# Patient Record
Sex: Female | Born: 1945 | Race: White | Hispanic: No | Marital: Married | Smoking: Never smoker
Health system: Southern US, Community
[De-identification: ages and names within clinical notes are randomized; demographics above are authoritative.]

## PROBLEM LIST (undated history)

## (undated) DIAGNOSIS — E079 Disorder of thyroid, unspecified: Secondary | ICD-10-CM

## (undated) DIAGNOSIS — K469 Unspecified abdominal hernia without obstruction or gangrene: Secondary | ICD-10-CM

## (undated) DIAGNOSIS — I10 Essential (primary) hypertension: Secondary | ICD-10-CM

## (undated) DIAGNOSIS — I499 Cardiac arrhythmia, unspecified: Secondary | ICD-10-CM

## (undated) DIAGNOSIS — G709 Myoneural disorder, unspecified: Secondary | ICD-10-CM

## (undated) DIAGNOSIS — N189 Chronic kidney disease, unspecified: Secondary | ICD-10-CM

## (undated) DIAGNOSIS — T8859XA Other complications of anesthesia, initial encounter: Secondary | ICD-10-CM

## (undated) DIAGNOSIS — C801 Malignant (primary) neoplasm, unspecified: Secondary | ICD-10-CM

## (undated) DIAGNOSIS — M1990 Unspecified osteoarthritis, unspecified site: Secondary | ICD-10-CM

## (undated) DIAGNOSIS — T4145XA Adverse effect of unspecified anesthetic, initial encounter: Secondary | ICD-10-CM

## (undated) DIAGNOSIS — E039 Hypothyroidism, unspecified: Secondary | ICD-10-CM

## (undated) HISTORY — DX: Disorder of thyroid, unspecified: E07.9

## (undated) HISTORY — DX: Unspecified osteoarthritis, unspecified site: M19.90

## (undated) HISTORY — DX: Malignant (primary) neoplasm, unspecified: C80.1

## (undated) HISTORY — PX: HERNIA REPAIR: SHX51

## (undated) HISTORY — DX: Unspecified abdominal hernia without obstruction or gangrene: K46.9

## (undated) HISTORY — PX: OTHER SURGICAL HISTORY: SHX169

## (undated) HISTORY — PX: MELANOMA EXCISION: SHX5266

## (undated) HISTORY — PX: CYSTOSCOPY: SHX5120

---

## 1998-03-29 ENCOUNTER — Other Ambulatory Visit: Admission: RE | Admit: 1998-03-29 | Discharge: 1998-03-29 | Payer: Self-pay | Admitting: *Deleted

## 1999-04-04 ENCOUNTER — Other Ambulatory Visit: Admission: RE | Admit: 1999-04-04 | Discharge: 1999-04-04 | Payer: Self-pay | Admitting: *Deleted

## 1999-05-09 ENCOUNTER — Encounter: Admission: RE | Admit: 1999-05-09 | Discharge: 1999-08-07 | Payer: Self-pay | Admitting: Cardiothoracic Surgery

## 2000-03-02 ENCOUNTER — Encounter: Payer: Self-pay | Admitting: Cardiothoracic Surgery

## 2000-03-02 ENCOUNTER — Encounter: Admission: RE | Admit: 2000-03-02 | Discharge: 2000-03-02 | Payer: Self-pay | Admitting: Cardiothoracic Surgery

## 2000-04-05 ENCOUNTER — Other Ambulatory Visit: Admission: RE | Admit: 2000-04-05 | Discharge: 2000-04-05 | Payer: Self-pay | Admitting: *Deleted

## 2001-03-14 ENCOUNTER — Encounter: Admission: RE | Admit: 2001-03-14 | Discharge: 2001-03-14 | Payer: Self-pay | Admitting: Cardiothoracic Surgery

## 2001-03-14 ENCOUNTER — Encounter: Payer: Self-pay | Admitting: Cardiothoracic Surgery

## 2002-03-17 ENCOUNTER — Ambulatory Visit (HOSPITAL_BASED_OUTPATIENT_CLINIC_OR_DEPARTMENT_OTHER): Admission: RE | Admit: 2002-03-17 | Discharge: 2002-03-17 | Payer: Self-pay

## 2004-05-23 ENCOUNTER — Other Ambulatory Visit: Admission: RE | Admit: 2004-05-23 | Discharge: 2004-05-23 | Payer: Self-pay | Admitting: Family Medicine

## 2009-06-22 ENCOUNTER — Encounter: Admission: RE | Admit: 2009-06-22 | Discharge: 2009-06-22 | Payer: Self-pay | Admitting: Chiropractor

## 2010-10-09 HISTORY — PX: OTHER SURGICAL HISTORY: SHX169

## 2010-10-09 HISTORY — PX: KNEE ARTHROSCOPY: SUR90

## 2010-10-18 ENCOUNTER — Ambulatory Visit
Admission: RE | Admit: 2010-10-18 | Discharge: 2010-10-18 | Payer: Self-pay | Source: Home / Self Care | Attending: Orthopedic Surgery | Admitting: Orthopedic Surgery

## 2010-10-24 LAB — POCT HEMOGLOBIN-HEMACUE: Hemoglobin: 12.8 g/dL (ref 12.0–15.0)

## 2010-10-24 LAB — BASIC METABOLIC PANEL
BUN: 13 mg/dL (ref 6–23)
CO2: 28 mEq/L (ref 19–32)
Calcium: 9.9 mg/dL (ref 8.4–10.5)
Chloride: 105 mEq/L (ref 96–112)
Creatinine, Ser: 0.89 mg/dL (ref 0.4–1.2)
GFR calc Af Amer: >60 mL/min (ref >60)
GFR calc non Af Amer: >60 mL/min (ref >60)
Glucose, Bld: 53 mg/dL — ABNORMAL LOW (ref 70–99)
Potassium: 3.2 mEq/L — ABNORMAL LOW (ref 3.5–5.1)
Sodium: 141 mEq/L (ref 135–145)

## 2011-01-02 ENCOUNTER — Encounter (HOSPITAL_BASED_OUTPATIENT_CLINIC_OR_DEPARTMENT_OTHER)
Admission: RE | Admit: 2011-01-02 | Discharge: 2011-01-02 | Disposition: A | Discharge status: Home / Self Care | Payer: BC Managed Care – PPO | Source: Ambulatory Visit | Attending: Surgery | Admitting: Surgery

## 2011-01-02 LAB — BASIC METABOLIC PANEL
BUN: 14 mg/dL (ref 6–23)
CO2: 29 mEq/L (ref 19–32)
Calcium: 9.8 mg/dL (ref 8.4–10.5)
Chloride: 105 mEq/L (ref 96–112)
Creatinine, Ser: 0.79 mg/dL (ref 0.4–1.2)
GFR calc Af Amer: >60 mL/min (ref >60)
GFR calc non Af Amer: >60 mL/min (ref >60)
Glucose, Bld: 92 mg/dL (ref 70–99)
Potassium: 3.8 mEq/L (ref 3.5–5.1)
Sodium: 138 mEq/L (ref 135–145)

## 2011-01-03 ENCOUNTER — Other Ambulatory Visit: Payer: Self-pay | Admitting: Surgery

## 2011-01-03 ENCOUNTER — Ambulatory Visit (HOSPITAL_BASED_OUTPATIENT_CLINIC_OR_DEPARTMENT_OTHER)
Admission: RE | Admit: 2011-01-03 | Discharge: 2011-01-03 | Disposition: A | Discharge status: Home / Self Care | Payer: BC Managed Care – PPO | Source: Ambulatory Visit | Attending: Surgery | Admitting: Surgery

## 2011-01-03 DIAGNOSIS — I712 Thoracic aortic aneurysm, without rupture, unspecified: Secondary | ICD-10-CM | POA: Insufficient documentation

## 2011-01-03 DIAGNOSIS — K429 Umbilical hernia without obstruction or gangrene: Secondary | ICD-10-CM | POA: Insufficient documentation

## 2011-01-03 DIAGNOSIS — E039 Hypothyroidism, unspecified: Secondary | ICD-10-CM | POA: Insufficient documentation

## 2011-01-03 DIAGNOSIS — K4090 Unilateral inguinal hernia, without obstruction or gangrene, not specified as recurrent: Secondary | ICD-10-CM | POA: Insufficient documentation

## 2011-01-03 DIAGNOSIS — Z01812 Encounter for preprocedural laboratory examination: Secondary | ICD-10-CM | POA: Insufficient documentation

## 2011-01-03 LAB — POCT HEMOGLOBIN-HEMACUE: Hemoglobin: 13.4 g/dL (ref 12.0–15.0)

## 2011-01-06 NOTE — Op Note (Signed)
NAME:  Donna Alvarez, Donna Alvarez NO.:  [id number]  MEDICAL RECORD NO.:  [id number]           PATIENT TYPE:  LOCATION:                                 FACILITY:  PHYSICIAN:  Currie Paris, M.D.DATE OF BIRTH:  1945-11-14  DATE OF PROCEDURE:  01/03/2011 DATE OF DISCHARGE:                              OPERATIVE REPORT   OFFICE MEDICAL CCS:  [phone].  PREOPERATIVE DIAGNOSES: 1. Right inguinal hernia, probably indirect. 2. Umbilical hernia.  POSTOPERATIVE DIAGNOSES: 1. Indirect right inguinal hernia. 2. Umbilical hernia.  PROCEDURE:  Repair with mesh.  SURGEON:  Currie Paris, MD  ANESTHESIA:  General.  CLINICAL HISTORY:  This is a 65 year old who has a gradually enlarging right inguinal hernia, she has elected to have it repaired.  On exam, she was also noted to have a small umbilical hernia and wished to have this repaired at the same anesthesia.  DESCRIPTION OF PROCEDURE:  I saw the patient in the holding area and confirmed the plans as noted above, she had no further questions.  I initialed the right inguinal area and the umbilical area as operative sites.  The patient was taken to the operating room, and after satisfactory general (LMA) anesthesia had been obtained, the abdomen was clipped, prepped, and draped and the time-out done.  I injected 0.25% plain Marcaine along the right inguinal area and subfascially.  I made an inguinal incision, exposed the external oblique, and opened it in line of its fibers.  The "cord" was identified and dissected up and divided distally right as it attached to the pubic tubercle with that being tied with a 4-0 Vicryl.  There was a sac present which was opened and there had been what looked like a little omentum protruding out, but I pushed all that back and ligated the entire cord structures with suture ligature near the deep ring using 2-0 silk.  I then took some Ultrapro and did an entire coverage of the  floor, sutured in starting at pubic tubercle running along the reflection of the inguinal ligament throughout as well beyond the deep ring.  Laid well up on the internal oblique muscle and then out laterally and was tacked down.  I put more local in and then made sure everything was dry. I closed the external oblique with 3-0 Vicryl, Scarpa's with 3-0 Vicryl, and the skin with 4-0 Monocryl subcuticular plus Dermabond.  Attention was turned to the umbilical area and I put a local in there and made a short curvilinear incision at the bottom of the umbilicus and elevated the skin.  I initially saw some preperitoneal fat protruding out, freed that up, and then there was some more preperitoneal fat in the subcutaneous tissue which was freed up off the skin, and I could see there was a single defect which is about 1 cm contained only some of this preperitoneal fat.  I reduced all of that and exposed fascia.  I cut a small piece of Ultrapro and fashioned it into a plug, put it in the defect, and closed the defect with three sutures of 2-0 Prolene incorporating the mesh.  This closed easily.  I  made sure everything was dry and then closed with some 3-0 Vicryl, 4-0 Monocryl subcuticular, and Dermabond.  The patient tolerated the procedure well, and there were no complications.  All counts were correct.     Currie Paris, M.D.     CJS/MEDQ  D:  01/03/2011  T:  01/04/2011  Job:  [id number]  cc:   Dr. Ulla Potash  Electronically Signed by Cyndia Bent M.D. on 01/06/2011 07:25:17 AM

## 2011-02-24 ENCOUNTER — Encounter (INDEPENDENT_AMBULATORY_CARE_PROVIDER_SITE_OTHER): Payer: Self-pay | Admitting: Surgery

## 2011-02-24 NOTE — Op Note (Signed)
[place]  Patient:    Donna Alvarez, ELLISTON Visit Number: [id number] MRN: [id number]          Service Type: DSU Location: [facility] Attending Physician:  Meredith Leeds Dictated by:   Zigmund Daniel, M.D. Proc. Date: 03/17/02 Admit Date:  03/17/2002 Discharge Date: 03/17/2002                             Operative Report  PREOPERATIVE DIAGNOSIS:  Right femoral hernia.  POSTOPERATIVE DIAGNOSIS:  Right femoral hernia.  PROCEDURE:  Repair of right femoral hernia.  SURGEON:  Zigmund Daniel, M.D.  ANESTHESIA:  Local with sedation.  DESCRIPTION OF PROCEDURE:  After the patient was monitored and sedated and had routine preparation and draping of the right groin region, I liberally infused long-acting local anesthetic in and around the palpable mass just above the right groin crease.  I then made a short incision over it and dissected down to it and separated it from the surrounding normal fat.  I noted that it was coming out through the femoral canal through a narrow neck.  After delineating the anatomy, I cut the lacunar ligament medial to the hernia about 1 cm. That allowed reduction of the hernia without opening the hernia sac.  I then plugged the defect with a rolled-up plug of polypropylene mesh and sewed that in with three sutures of 2-0 Prolene placed in the superior, medial, and inferior aspects of the defect, avoiding the femoral vein.  That appeared secure.  Hemostasis was good.  I closed the subcutaneous tissues with running 3-0 Vicryl and closed the skin with intracuticular 4-0 Vicryl and Steri-Strips.  She tolerated the operation well. Dictated by:   Zigmund Daniel, M.D. Attending Physician:  Meredith Leeds DD:  03/17/02 TD:  03/19/02 Job: [id number]

## 2011-09-26 ENCOUNTER — Ambulatory Visit (INDEPENDENT_AMBULATORY_CARE_PROVIDER_SITE_OTHER): Payer: BC Managed Care – PPO

## 2011-09-26 DIAGNOSIS — J029 Acute pharyngitis, unspecified: Secondary | ICD-10-CM

## 2011-09-26 DIAGNOSIS — R059 Cough, unspecified: Secondary | ICD-10-CM

## 2011-09-26 DIAGNOSIS — R05 Cough: Secondary | ICD-10-CM

## 2011-09-26 DIAGNOSIS — J111 Influenza due to unidentified influenza virus with other respiratory manifestations: Secondary | ICD-10-CM

## 2012-12-17 ENCOUNTER — Other Ambulatory Visit: Payer: Self-pay | Admitting: Neurosurgery

## 2012-12-18 ENCOUNTER — Encounter (HOSPITAL_COMMUNITY): Payer: Self-pay | Admitting: Pharmacy Technician

## 2012-12-21 NOTE — Pre-Procedure Instructions (Signed)
YECENIA DALGLEISH  12/21/2012   Your procedure is scheduled on:  12/25/2012  Report to [facility] at 6:45 AM.  Call this number if you have problems the morning of surgery: [phone]   Remember:   Do not eat food or drink liquids after midnight.  TUESDAY   Take these medicines the morning of surgery with A SIP OF WATER: Levoxyl, Norvasc, Gabapentin   Do not wear jewelry, make-up or nail polish.  Do not wear lotions, powders, or perfumes. You may wear deodorant.  Do not shave 48 hours prior to surgery.   Do not bring valuables to the hospital.  Contacts, dentures or bridgework may not be worn into surgery.  Leave suitcase in the car. After surgery it may be brought to your room.  For patients admitted to the hospital, checkout time is 11:00 AM the day of  discharge.   Patients discharged the day of surgery will not be allowed to drive  home.  Name and phone number of your driver: with spouse  Special Instructions: Shower using CHG 2 nights before surgery and the night before surgery.  If you shower the day of surgery use CHG.  Use special wash - you have one bottle of CHG for all showers.  You should use approximately 1/3 of the bottle for each shower.   Please read over the following fact sheets that you were given: Pain Booklet, Coughing and Deep Breathing, MRSA Information and Surgical Site Infection Prevention

## 2012-12-23 ENCOUNTER — Encounter (HOSPITAL_COMMUNITY)
Admission: RE | Admit: 2012-12-23 | Discharge: 2012-12-23 | Disposition: A | Discharge status: Home / Self Care | Payer: BC Managed Care – PPO | Source: Ambulatory Visit | Attending: Neurosurgery | Admitting: Neurosurgery

## 2012-12-23 ENCOUNTER — Encounter (HOSPITAL_COMMUNITY): Payer: Self-pay

## 2012-12-23 HISTORY — DX: Myoneural disorder, unspecified: G70.9

## 2012-12-23 HISTORY — DX: Other complications of anesthesia, initial encounter: T88.59XA

## 2012-12-23 HISTORY — DX: Chronic kidney disease, unspecified: N18.9

## 2012-12-23 HISTORY — DX: Cardiac arrhythmia, unspecified: I49.9

## 2012-12-23 HISTORY — DX: Essential (primary) hypertension: I10

## 2012-12-23 HISTORY — DX: Hypothyroidism, unspecified: E03.9

## 2012-12-23 HISTORY — DX: Adverse effect of unspecified anesthetic, initial encounter: T41.45XA

## 2012-12-23 LAB — SURGICAL PCR SCREEN
MRSA, PCR: NEGATIVE
Staphylococcus aureus: POSITIVE — AB

## 2012-12-23 LAB — BASIC METABOLIC PANEL
Calcium: 10.3 mg/dL (ref 8.4–10.5)
Chloride: 106 mEq/L (ref 96–112)
Creatinine, Ser: 0.7 mg/dL (ref 0.50–1.10)
GFR calc Af Amer: >90 mL/min (ref >90)
GFR calc non Af Amer: 87 mL/min — ABNORMAL LOW (ref >90)

## 2012-12-23 LAB — CBC
MCHC: 35.5 g/dL (ref 30.0–36.0)
Platelets: 250 10*3/uL (ref 150–400)
RDW: 13.1 % (ref 11.5–15.5)
WBC: 5.8 10*3/uL (ref 4.0–10.5)

## 2012-12-24 MED ORDER — CEFAZOLIN SODIUM-DEXTROSE 2-3 GM-% IV SOLR
2.0000 g | INTRAVENOUS | Status: AC
  Administered 2012-12-25: 2 g via INTRAVENOUS
  Filled 2012-12-24: qty 50

## 2012-12-25 ENCOUNTER — Ambulatory Visit (HOSPITAL_COMMUNITY)
Admission: RE | Admit: 2012-12-25 | Discharge: 2012-12-25 | DRG: 758 | Disposition: A | Discharge status: Home / Self Care | Payer: BC Managed Care – PPO | Source: Ambulatory Visit | Attending: Neurosurgery | Admitting: Neurosurgery

## 2012-12-25 ENCOUNTER — Encounter (HOSPITAL_COMMUNITY)
Admission: RE | Disposition: A | Discharge status: Home / Self Care | Payer: Self-pay | Source: Ambulatory Visit | Attending: Neurosurgery

## 2012-12-25 ENCOUNTER — Inpatient Hospital Stay (HOSPITAL_COMMUNITY): Payer: BC Managed Care – PPO

## 2012-12-25 ENCOUNTER — Encounter (HOSPITAL_COMMUNITY): Payer: Self-pay | Admitting: Anesthesiology

## 2012-12-25 ENCOUNTER — Encounter (HOSPITAL_COMMUNITY): Payer: Self-pay | Admitting: *Deleted

## 2012-12-25 ENCOUNTER — Inpatient Hospital Stay (HOSPITAL_COMMUNITY): Payer: BC Managed Care – PPO | Admitting: Anesthesiology

## 2012-12-25 DIAGNOSIS — Z791 Long term (current) use of non-steroidal anti-inflammatories (NSAID): Secondary | ICD-10-CM

## 2012-12-25 DIAGNOSIS — Z01818 Encounter for other preprocedural examination: Secondary | ICD-10-CM | POA: Insufficient documentation

## 2012-12-25 DIAGNOSIS — Z85828 Personal history of other malignant neoplasm of skin: Secondary | ICD-10-CM

## 2012-12-25 DIAGNOSIS — M4310 Spondylolisthesis, site unspecified: Secondary | ICD-10-CM | POA: Diagnosis present

## 2012-12-25 DIAGNOSIS — G2581 Restless legs syndrome: Secondary | ICD-10-CM | POA: Diagnosis present

## 2012-12-25 DIAGNOSIS — I10 Essential (primary) hypertension: Secondary | ICD-10-CM | POA: Diagnosis present

## 2012-12-25 DIAGNOSIS — M19049 Primary osteoarthritis, unspecified hand: Secondary | ICD-10-CM | POA: Diagnosis present

## 2012-12-25 DIAGNOSIS — Z79899 Other long term (current) drug therapy: Secondary | ICD-10-CM

## 2012-12-25 DIAGNOSIS — E039 Hypothyroidism, unspecified: Secondary | ICD-10-CM | POA: Diagnosis present

## 2012-12-25 DIAGNOSIS — M5126 Other intervertebral disc displacement, lumbar region: Secondary | ICD-10-CM | POA: Diagnosis present

## 2012-12-25 DIAGNOSIS — Z01812 Encounter for preprocedural laboratory examination: Secondary | ICD-10-CM | POA: Insufficient documentation

## 2012-12-25 DIAGNOSIS — Z87442 Personal history of urinary calculi: Secondary | ICD-10-CM

## 2012-12-25 DIAGNOSIS — Z85820 Personal history of malignant melanoma of skin: Secondary | ICD-10-CM

## 2012-12-25 HISTORY — PX: LUMBAR LAMINECTOMY/DECOMPRESSION MICRODISCECTOMY: SHX5026

## 2012-12-25 SURGERY — LUMBAR LAMINECTOMY/DECOMPRESSION MICRODISCECTOMY 1 LEVEL
Anesthesia: General | Laterality: Right | Wound class: Clean

## 2012-12-25 MED ORDER — OXYCODONE-ACETAMINOPHEN 5-325 MG PO TABS
1.0000 | ORAL_TABLET | ORAL | Status: DC | PRN
  Administered 2012-12-25: 1 via ORAL
  Filled 2012-12-25: qty 1

## 2012-12-25 MED ORDER — ACETAMINOPHEN 325 MG PO TABS: 650.0000 mg | ORAL_TABLET | ORAL | Status: DC | PRN

## 2012-12-25 MED ORDER — NEOSTIGMINE METHYLSULFATE 1 MG/ML IJ SOLN
INTRAMUSCULAR | Status: DC | PRN
  Administered 2012-12-25: 3 mg via INTRAVENOUS

## 2012-12-25 MED ORDER — OXYCODONE HCL 5 MG/5ML PO SOLN
5.0000 mg | Freq: Once | ORAL | Status: DC | PRN
Start: 2012-12-25 — End: 2012-12-25

## 2012-12-25 MED ORDER — OXYCODONE-ACETAMINOPHEN 5-325 MG PO TABS: 1.0000 | ORAL_TABLET | Freq: Four times a day (QID) | ORAL | Status: DC | PRN

## 2012-12-25 MED ORDER — ALUM & MAG HYDROXIDE-SIMETH 200-200-20 MG/5ML PO SUSP: 30.0000 mL | Freq: Four times a day (QID) | ORAL | Status: DC | PRN

## 2012-12-25 MED ORDER — PHENOL 1.4 % MT LIQD: 1.0000 | OROMUCOSAL | Status: DC | PRN

## 2012-12-25 MED ORDER — DOCUSATE SODIUM 100 MG PO CAPS: 100.0000 mg | ORAL_CAPSULE | Freq: Two times a day (BID) | ORAL | Status: DC

## 2012-12-25 MED ORDER — BUPIVACAINE-EPINEPHRINE PF 0.5-1:200000 % IJ SOLN
INTRAMUSCULAR | Status: DC | PRN
  Administered 2012-12-25: 20 mL

## 2012-12-25 MED ORDER — MIDAZOLAM HCL 5 MG/5ML IJ SOLN
INTRAMUSCULAR | Status: DC | PRN
  Administered 2012-12-25: 2 mg via INTRAVENOUS

## 2012-12-25 MED ORDER — FUROSEMIDE 20 MG PO TABS
20.0000 mg | ORAL_TABLET | Freq: Every day | ORAL | Status: DC
  Filled 2012-12-25: qty 1

## 2012-12-25 MED ORDER — OXYCODONE HCL 5 MG PO TABS: 5.0000 mg | ORAL_TABLET | Freq: Once | ORAL | Status: DC | PRN

## 2012-12-25 MED ORDER — 0.9 % SODIUM CHLORIDE (POUR BTL) OPTIME
TOPICAL | Status: DC | PRN
  Administered 2012-12-25: 1000 mL

## 2012-12-25 MED ORDER — PHENYLEPHRINE HCL 10 MG/ML IJ SOLN
INTRAMUSCULAR | Status: DC | PRN
  Administered 2012-12-25 (×3): 80 ug via INTRAVENOUS

## 2012-12-25 MED ORDER — ONDANSETRON HCL 4 MG/2ML IJ SOLN: 4.0000 mg | INTRAMUSCULAR | Status: DC | PRN

## 2012-12-25 MED ORDER — IBUPROFEN 400 MG PO TABS
400.0000 mg | ORAL_TABLET | Freq: Four times a day (QID) | ORAL | Status: DC | PRN
  Filled 2012-12-25: qty 1

## 2012-12-25 MED ORDER — FENTANYL CITRATE 0.05 MG/ML IJ SOLN
INTRAMUSCULAR | Status: DC | PRN
  Administered 2012-12-25 (×3): 50 ug via INTRAVENOUS

## 2012-12-25 MED ORDER — SODIUM CHLORIDE 0.9 % IR SOLN
Status: DC | PRN
  Administered 2012-12-25: 09:00:00

## 2012-12-25 MED ORDER — ACETAMINOPHEN 650 MG RE SUPP: 650.0000 mg | RECTAL | Status: DC | PRN

## 2012-12-25 MED ORDER — ARTIFICIAL TEARS OP OINT
TOPICAL_OINTMENT | OPHTHALMIC | Status: DC | PRN
  Administered 2012-12-25: 1 via OPHTHALMIC

## 2012-12-25 MED ORDER — BACITRACIN 50000 UNITS IM SOLR
INTRAMUSCULAR | Status: AC
  Filled 2012-12-25: qty 1

## 2012-12-25 MED ORDER — PROPOFOL 10 MG/ML IV BOLUS
INTRAVENOUS | Status: DC | PRN
  Administered 2012-12-25: 150 mg via INTRAVENOUS

## 2012-12-25 MED ORDER — HEMOSTATIC AGENTS (NO CHARGE) OPTIME
TOPICAL | Status: DC | PRN
  Administered 2012-12-25: 1 via TOPICAL

## 2012-12-25 MED ORDER — GABAPENTIN 300 MG PO CAPS
300.0000 mg | ORAL_CAPSULE | Freq: Three times a day (TID) | ORAL | Status: DC
Start: 2012-12-25 — End: 2012-12-25
  Administered 2012-12-25: 300 mg via ORAL
  Filled 2012-12-25 (×2): qty 1

## 2012-12-25 MED ORDER — POTASSIUM CHLORIDE CRYS ER 10 MEQ PO TBCR
10.0000 meq | EXTENDED_RELEASE_TABLET | Freq: Two times a day (BID) | ORAL | Status: DC
  Administered 2012-12-25: 10 meq via ORAL
  Filled 2012-12-25 (×2): qty 1

## 2012-12-25 MED ORDER — THROMBIN 5000 UNITS EX SOLR
CUTANEOUS | Status: DC | PRN
  Administered 2012-12-25 (×2): 5000 [IU] via TOPICAL

## 2012-12-25 MED ORDER — EPHEDRINE SULFATE 50 MG/ML IJ SOLN
INTRAMUSCULAR | Status: DC | PRN
  Administered 2012-12-25: 10 mg via INTRAVENOUS

## 2012-12-25 MED ORDER — DIAZEPAM 5 MG PO TABS: 5.0000 mg | ORAL_TABLET | Freq: Four times a day (QID) | ORAL | Status: DC | PRN

## 2012-12-25 MED ORDER — MENTHOL 3 MG MT LOZG
1.0000 | LOZENGE | OROMUCOSAL | Status: DC | PRN
  Administered 2012-12-25: 3 mg via ORAL
  Filled 2012-12-25: qty 9

## 2012-12-25 MED ORDER — LIDOCAINE HCL (CARDIAC) 20 MG/ML IV SOLN
INTRAVENOUS | Status: DC | PRN
  Administered 2012-12-25: 80 mg via INTRAVENOUS

## 2012-12-25 MED ORDER — LACTATED RINGERS IV SOLN: INTRAVENOUS | Status: DC

## 2012-12-25 MED ORDER — AMLODIPINE BESYLATE 2.5 MG PO TABS
2.5000 mg | ORAL_TABLET | Freq: Every day | ORAL | Status: DC
  Filled 2012-12-25: qty 1

## 2012-12-25 MED ORDER — HYDROCODONE-ACETAMINOPHEN 5-325 MG PO TABS: 1.0000 | ORAL_TABLET | ORAL | Status: DC | PRN

## 2012-12-25 MED ORDER — PRAMIPEXOLE DIHYDROCHLORIDE 1.5 MG PO TABS
1.5000 mg | ORAL_TABLET | Freq: Every day | ORAL | Status: DC
  Filled 2012-12-25: qty 1

## 2012-12-25 MED ORDER — LACTATED RINGERS IV SOLN
INTRAVENOUS | Status: DC | PRN
  Administered 2012-12-25: 08:00:00 via INTRAVENOUS

## 2012-12-25 MED ORDER — PROMETHAZINE HCL 25 MG/ML IJ SOLN: 6.2500 mg | INTRAMUSCULAR | Status: DC | PRN

## 2012-12-25 MED ORDER — CEFAZOLIN SODIUM-DEXTROSE 2-3 GM-% IV SOLR
2.0000 g | Freq: Three times a day (TID) | INTRAVENOUS | Status: DC
  Administered 2012-12-25: 2 g via INTRAVENOUS
  Filled 2012-12-25 (×3): qty 50

## 2012-12-25 MED ORDER — ROCURONIUM BROMIDE 100 MG/10ML IV SOLN
INTRAVENOUS | Status: DC | PRN
  Administered 2012-12-25: 40 mg via INTRAVENOUS

## 2012-12-25 MED ORDER — GLYCOPYRROLATE 0.2 MG/ML IJ SOLN
INTRAMUSCULAR | Status: DC | PRN
  Administered 2012-12-25: 0.4 mg via INTRAVENOUS

## 2012-12-25 MED ORDER — ONDANSETRON HCL 4 MG/2ML IJ SOLN
INTRAMUSCULAR | Status: DC | PRN
  Administered 2012-12-25: 4 mg via INTRAVENOUS

## 2012-12-25 MED ORDER — LEVOTHYROXINE SODIUM 50 MCG PO TABS
50.0000 ug | ORAL_TABLET | Freq: Every day | ORAL | Status: DC
  Filled 2012-12-25: qty 1

## 2012-12-25 MED ORDER — MORPHINE SULFATE 2 MG/ML IJ SOLN: 1.0000 mg | INTRAMUSCULAR | Status: DC | PRN

## 2012-12-25 MED ORDER — BACITRACIN ZINC 500 UNIT/GM EX OINT
TOPICAL_OINTMENT | CUTANEOUS | Status: DC | PRN
  Administered 2012-12-25: 1 via TOPICAL

## 2012-12-25 MED ORDER — SODIUM CHLORIDE 0.9 % IV SOLN
INTRAVENOUS | Status: AC
  Filled 2012-12-25: qty 500

## 2012-12-25 MED ORDER — HYDROMORPHONE HCL PF 1 MG/ML IJ SOLN: 0.2500 mg | INTRAMUSCULAR | Status: DC | PRN

## 2012-12-25 SURGICAL SUPPLY — 52 items
BAG DECANTER FOR FLEXI CONT (MISCELLANEOUS) ×2 IMPLANT
BENZOIN TINCTURE PRP APPL 2/3 (GAUZE/BANDAGES/DRESSINGS) ×2 IMPLANT
BLADE SURG ROTATE 9660 (MISCELLANEOUS) IMPLANT
BRUSH SCRUB EZ PLAIN DRY (MISCELLANEOUS) ×2 IMPLANT
BUR ACORN 6.0 (BURR) ×2 IMPLANT
BUR MATCHSTICK NEURO 3.0 LAGG (BURR) ×2 IMPLANT
CANISTER SUCTION 2500CC (MISCELLANEOUS) ×2 IMPLANT
CLOTH BEACON ORANGE TIMEOUT ST (SAFETY) ×2 IMPLANT
CONT SPEC 4OZ CLIKSEAL STRL BL (MISCELLANEOUS) ×2 IMPLANT
DRAPE LAPAROTOMY 100X72X124 (DRAPES) ×2 IMPLANT
DRAPE MICROSCOPE LEICA (MISCELLANEOUS) ×2 IMPLANT
DRAPE POUCH INSTRU U-SHP 10X18 (DRAPES) ×2 IMPLANT
DRAPE SURG 17X23 STRL (DRAPES) ×8 IMPLANT
ELECT BLADE 4.0 EZ CLEAN MEGAD (MISCELLANEOUS) ×2
ELECT REM PT RETURN 9FT ADLT (ELECTROSURGICAL) ×2
ELECTRODE BLDE 4.0 EZ CLN MEGD (MISCELLANEOUS) ×1 IMPLANT
ELECTRODE REM PT RTRN 9FT ADLT (ELECTROSURGICAL) ×1 IMPLANT
GAUZE SPONGE 4X4 16PLY XRAY LF (GAUZE/BANDAGES/DRESSINGS) IMPLANT
GLOVE BIO SURGEON STRL SZ8.5 (GLOVE) ×2 IMPLANT
GLOVE BIOGEL PI IND STRL 6.5 (GLOVE) ×1 IMPLANT
GLOVE BIOGEL PI INDICATOR 6.5 (GLOVE) ×1
GLOVE EXAM NITRILE LRG STRL (GLOVE) ×2 IMPLANT
GLOVE EXAM NITRILE MD LF STRL (GLOVE) IMPLANT
GLOVE EXAM NITRILE XL STR (GLOVE) IMPLANT
GLOVE EXAM NITRILE XS STR PU (GLOVE) IMPLANT
GLOVE INDICATOR 7.0 STRL GRN (GLOVE) ×4 IMPLANT
GLOVE OPTIFIT SS 6.5 STRL BRWN (GLOVE) ×4 IMPLANT
GLOVE SS BIOGEL STRL SZ 8 (GLOVE) ×1 IMPLANT
GLOVE SUPERSENSE BIOGEL SZ 8 (GLOVE) ×1
GOWN BRE IMP SLV AUR LG STRL (GOWN DISPOSABLE) ×4 IMPLANT
GOWN BRE IMP SLV AUR XL STRL (GOWN DISPOSABLE) ×2 IMPLANT
GOWN STRL REIN 2XL LVL4 (GOWN DISPOSABLE) IMPLANT
KIT BASIN OR (CUSTOM PROCEDURE TRAY) ×2 IMPLANT
KIT ROOM TURNOVER OR (KITS) ×2 IMPLANT
NEEDLE HYPO 21X1.5 SAFETY (NEEDLE) IMPLANT
NEEDLE HYPO 22GX1.5 SAFETY (NEEDLE) ×2 IMPLANT
NS IRRIG 1000ML POUR BTL (IV SOLUTION) ×2 IMPLANT
PACK LAMINECTOMY NEURO (CUSTOM PROCEDURE TRAY) ×2 IMPLANT
PAD ARMBOARD 7.5X6 YLW CONV (MISCELLANEOUS) ×6 IMPLANT
PATTIES SURGICAL .5 X1 (DISPOSABLE) IMPLANT
RUBBERBAND STERILE (MISCELLANEOUS) ×4 IMPLANT
SPONGE GAUZE 4X4 12PLY (GAUZE/BANDAGES/DRESSINGS) ×2 IMPLANT
SPONGE SURGIFOAM ABS GEL SZ50 (HEMOSTASIS) ×2 IMPLANT
STRIP CLOSURE SKIN 1/2X4 (GAUZE/BANDAGES/DRESSINGS) ×2 IMPLANT
SUT VIC AB 1 CT1 18XBRD ANBCTR (SUTURE) ×1 IMPLANT
SUT VIC AB 1 CT1 8-18 (SUTURE) ×1
SUT VIC AB 2-0 CP2 18 (SUTURE) ×2 IMPLANT
SYR 20CC LL (SYRINGE) IMPLANT
SYR 20ML ECCENTRIC (SYRINGE) ×2 IMPLANT
TOWEL OR 17X24 6PK STRL BLUE (TOWEL DISPOSABLE) ×2 IMPLANT
TOWEL OR 17X26 10 PK STRL BLUE (TOWEL DISPOSABLE) ×2 IMPLANT
WATER STERILE IRR 1000ML POUR (IV SOLUTION) ×2 IMPLANT

## 2012-12-25 NOTE — Discharge Summary (Signed)
Physician Discharge Summary  Patient ID: Donna Alvarez MRN: [id number] DOB/AGE: September 07, 1946 67 y.o.  Admit date: 12/25/2012 Discharge date: 12/25/2012  Admission Diagnoses:Right L5-S1 herniated disc, spinal stenosis, L4-5 spondylolisthesis, lumbago, lumbar radiculopathy   Discharge Diagnoses: Right L5-S1 herniated disc, spinal stenosis, L4-5 spondylolisthesis, lumbago, lumbar radiculopathy  Active Problems:   * No active hospital problems. *   Discharged Condition: good  Hospital Course: [name] was taken to the operating room where she underwent an uncomplicated Right L5/S1 lumbar laminectomy today. She has voided, ambulated, and tolerated a normal diet without difficulty. Her wound is clean, dry, and without signs of infection at discharge.  Consults: None  Significant Diagnostic Studies: none  Treatments: Right L5-S1 Intervertebral discectomy using micro-dissection   Discharge Exam: Blood pressure 94/54, pulse 56, temperature 97.6 F (36.4 C), temperature source Oral, resp. rate 18, SpO2 99.00%. General appearance: alert, cooperative, appears stated age and no distress Neurologic: Alert and oriented X 3, normal strength and tone. Normal symmetric reflexes. Normal coordination and gait  Disposition: 01-Home or Self Care     Medication List    TAKE these medications       amLODipine 2.5 MG tablet  Commonly known as:  NORVASC  Take 2.5 mg by mouth daily after breakfast.     calcitonin (salmon) 200 UNIT/ACT nasal spray  Commonly known as:  MIACALCIN/FORTICAL  Place 1 spray into the nose daily.     Cholecalciferol 3000 UNITS Tabs  Take 3,000 Units by mouth daily.     diazepam 5 MG tablet  Commonly known as:  VALIUM  Take 1 tablet (5 mg total) by mouth every 6 (six) hours as needed for anxiety (muscle spasms).     furosemide 20 MG tablet  Commonly known as:  LASIX  Take 20 mg by mouth daily after breakfast.     gabapentin 300 MG capsule  Commonly  known as:  NEURONTIN  Take 300 mg by mouth 3 (three) times daily.     ibuprofen 200 MG tablet  Commonly known as:  ADVIL,MOTRIN  Take 400 mg by mouth every 6 (six) hours as needed for pain.     KRILL OIL PO  Take 1 capsule by mouth daily.     LEVOXYL 50 MCG tablet  Generic drug:  levothyroxine  Take 50 mcg by mouth daily before breakfast.     oxyCODONE-acetaminophen 5-325 MG per tablet  Commonly known as:  ROXICET  Take 1 tablet by mouth every 6 (six) hours as needed for pain.     potassium chloride 10 MEQ CR capsule  Commonly known as:  MICRO-K  Take 10 mEq by mouth daily.     pramipexole 1.5 MG tablet  Commonly known as:  MIRAPEX  Take 1.5 mg by mouth at bedtime.           Follow-up Information   Follow up with Cristi Loron, MD In 3 weeks. (call to make appointment)    Contact information:   [address] [place] [address] [phone]       Signed: Carmela Hurt 12/25/2012, 6:16 PM

## 2012-12-25 NOTE — Preoperative (Signed)
Beta Blockers   Reason not to administer Beta Blockers:Not Applicable 

## 2012-12-25 NOTE — H&P (Signed)
Subjective: The patient is a 67 year old white female who has complained of back and right leg pain consistent with a lumbar radiculopathy. She has failed medical management and was worked up with a lumbar MRI. This demonstrated a herniated disc at L5-S1 on the right. I discussed the various treatment options with the patient including surgery. The patient has weighed the risks, benefits, and alternatives surgery and decided proceed with a right L5-S1 discectomy.   Past Medical History  Diagnosis Date  . Thyroid disease   . Hernia   . Complication of anesthesia     with anesth. 10/2010- very emotional & slow to wake up, 12/2010- no problems  . Hypertension   . Dysrhythmia   . Hypothyroidism   . Chronic kidney disease     kidney stones - 2013- treated /w surg. procedure   . 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 Neuromuscular disorder     uses Mirapex for RLS  . Arthritis     hands, +low back     Past Surgical History  Procedure Laterality Date  . Torn cartilage  2012  . Childbirth      x2   . Knee arthroscopy  10/2010    L  . Hernia repair      12/2010- both inguinal & umbilical done at same time  . Melanoma excision      posterior area of R leg.  . Cystoscopy      for stone removal     Not on File  History  Substance Use Topics  . Smoking status: Never Smoker   . Smokeless tobacco: Not on file  . Alcohol Use: No    Family History  Problem Relation Age of Onset  . Cancer Father   . Hypertension Father    Prior to Admission medications   Medication Sig Start Date End Date Taking? Authorizing Provider  amLODipine (NORVASC) 2.5 MG tablet Take 2.5 mg by mouth daily after breakfast.    Yes Historical Provider, MD  calcitonin, salmon, (MIACALCIN/FORTICAL) 200 UNIT/ACT nasal spray Place 1 spray into the nose daily.   Yes Historical Provider, MD  Cholecalciferol 3000 UNITS TABS Take 3,000 Units by mouth daily.   Yes Historical Provider, MD   furosemide (LASIX) 20 MG tablet Take 20 mg by mouth daily after breakfast.    Yes Historical Provider, MD  gabapentin (NEURONTIN) 300 MG capsule Take 300 mg by mouth 3 (three) times daily.    Yes Historical Provider, MD  ibuprofen (ADVIL,MOTRIN) 200 MG tablet Take 400 mg by mouth every 6 (six) hours as needed for pain.   Yes Historical Provider, MD  KRILL OIL PO Take 1 capsule by mouth daily.    Yes Historical Provider, MD  levothyroxine (LEVOXYL) 50 MCG tablet Take 50 mcg by mouth daily before breakfast.    Yes Historical Provider, MD  potassium chloride (MICRO-K) 10 MEQ CR capsule Take 10 mEq by mouth daily.   Yes Historical Provider, MD  pramipexole (MIRAPEX) 1.5 MG tablet Take 1.5 mg by mouth at bedtime.    Yes Historical Provider, MD     Review of Systems  Positive ROS: As above  All other systems have been reviewed and were otherwise negative with the exception of those mentioned in the HPI and as above.  Objective: Vital signs in last 24 hours: Temp:  [98.2 F (36.8 C)] 98.2 F (36.8 C) (03/19 0741) Pulse Rate:  [79] 79 (03/19 0741) Resp:  [  20] 20 (03/19 0741) BP: (148)/(80) 148/80 mmHg (03/19 0741) SpO2:  [97 %] 97 % (03/19 0741)  General Appearance: Alert, cooperative, no distress, appears stated age Head: Normocephalic, without obvious abnormality, atraumatic Eyes: PERRL, conjunctiva/corneas clear, EOM's intact, fundi benign, both eyes      Ears: Normal TM's and external ear canals, both ears Throat: Lips, mucosa, and tongue normal; teeth and gums normal Neck: Supple, symmetrical, trachea midline, no adenopathy; thyroid: No enlargement/tenderness/nodules; no carotid bruit or JVD Back: Symmetric, no curvature, ROM normal, no CVA tenderness Lungs: Clear to auscultation bilaterally, respirations unlabored Heart: Regular rate and rhythm, S1 and S2 normal, no murmur, rub or gallop Abdomen: Soft, non-tender, bowel sounds active all four quadrants, no masses, no  organomegaly Extremities: Extremities normal, atraumatic, no cyanosis or edema Pulses: 2+ and symmetric all extremities Skin: Skin color, texture, turgor normal, no rashes or lesions  NEUROLOGIC:   Mental status: alert and oriented, no aphasia, good attention span, Fund of knowledge/ memory ok Motor Exam - grossly normal Sensory Exam - grossly normal Reflexes:  Coordination - grossly normal Gait - grossly normal Balance - grossly normal Cranial Nerves: I: smell Not tested  II: visual acuity  OS: Normal    OD: Normal   II: visual fields Full to confrontation  II: pupils Equal, round, reactive to light  III,VII: ptosis None  III,IV,VI: extraocular muscles  Full ROM  V: mastication Normal  V: facial light touch sensation  Normal  V,VII: corneal reflex  Present  VII: facial muscle function - upper  Normal  VII: facial muscle function - lower Normal  VIII: hearing Not tested  IX: soft palate elevation  Normal  IX,X: gag reflex Present  XI: trapezius strength  5/5  XI: sternocleidomastoid strength 5/5  XI: neck flexion strength  5/5  XII: tongue strength  Normal    Data Review Lab Results  Component Value Date   WBC 5.8 12/23/2012   HGB 14.1 12/23/2012   HCT 39.7 12/23/2012   MCV 86.3 12/23/2012   PLT 250 12/23/2012   Lab Results  Component Value Date   NA 142 12/23/2012   K 3.5 12/23/2012   CL 106 12/23/2012   CO2 28 12/23/2012   BUN 11 12/23/2012   CREATININE 0.70 12/23/2012   GLUCOSE 95 12/23/2012   No results found for this basename: INR, PROTIME    Assessment/Plan: Right L5-S1 herniated disc, lumbar adenopathy, lumbago: I discussed situation with the patient. I have reviewed her MR scan with her and pointed out the abnormalities. We have discussed the various treatment options including surgery. I described the surgical option of a right L5-S1 discectomy. I have described the surgery to her. I've shown her surgical models. We have discussed the risks, benefits,  alternatives, and likelihood of achieving our goals with surgery. I have answered all the patient's questions. She has decided to proceed with surgery.   Donna Alvarez 12/25/2012 9:15 AM

## 2012-12-25 NOTE — Plan of Care (Signed)
Problem: Consults Goal: Diagnosis - Spinal Surgery Outcome: Completed/Met Date Met:  12/25/12 Microdiscectomy

## 2012-12-25 NOTE — Op Note (Signed)
Brief history: The patient is a 67 year old white female who has suffered from back and right leg pain consistent with a right S1 radiculopathy. She has failed medical management and was worked up with a lumbar MRI. This demonstrated a spondylolisthesis and spinal stenosis at L4-5 and a ruptured disc at L5-S1 on the right. I do not think the spondylolisthesis was symptomatic. I thought her symptoms were coming from the herniated disc at L5-S1. I discussed the various treatment options with the patient including surgery. She has weighed the risks, benefits, and alternatives surgery and decided proceed with a right L5-S1 discectomy.  Preoperative diagnosis: Right L5-S1 herniated disc, spinal stenosis, L4-5 spondylolisthesis, lumbago, lumbar radiculopathy  Postoperative diagnosis: The same  Procedure: Right L5-S1 Intervertebral discectomy using micro-dissection  Surgeon: Dr. Delma Officer  Asst.: None  Anesthesia: Gen. endotracheal  Estimated blood loss: 25 cc  Drains: None  Complications: None  Description of procedure: The patient was brought to the operating room by the anesthesia team. General endotracheal anesthesia was induced. The patient was turned to the prone position on the Wilson frame. The patient's lumbosacral region was then prepared with Betadine scrub and Betadine solution. Sterile drapes were applied.  I then injected the area to be incised with Marcaine with epinephrine solution. I then used a scalpel to make a linear midline incision over the L5-S1 intervertebral disc space. I then used electrocautery to perform a right sided subperiosteal dissection exposing the spinous process and lamina of L5 and upper sacrum. We obtained intraoperative radiograph to confirm our location. I then inserted the [place] retractor for exposure.  We then brought the operative microscope into the field. Under its magnification and illumination we completed the microdissection. I used a  high-speed drill to perform a laminotomy at L5. I then used a Kerrison punches to widen the laminotomy and removed the ligamentum flavum at L5-S1. We then used microdissection to free up the thecal sac and the right S1 nerve root from the epidural tissue. I then used a Kerrison punch to perform a foraminotomy at about the right S1 nerve root. We then using the nerve root retractor to gently retract the thecal sac and the right S1 nerve root medially. This exposed the intervertebral disc. We identified the ruptured disc and remove it with the pituitary forceps. We performed a partial intervertebral discectomy using the pituitary forceps and the Epstein curettes.  I then palpated along the ventral surface of the thecal sac and along exit route of the right S1 nerve root and noted that the neural structures were well decompressed. This completed the decompression.  We then obtained hemostasis using bipolar electrocautery. We irrigated the wound out with bacitracin solution. We then removed the retractor. We then reapproximated the patient's thoracolumbar fascia with interrupted #1 Vicryl suture. We then reapproximated the patient's subcutaneous tissue with interrupted 3-0 Vicryl suture. We then reapproximated patient's skin with Steri-Strips and benzoin. The was then coated with bacitracin ointment. The drapes were removed. The patient was subsequently returned to the supine position where they were extubated by the anesthesia team. The patient was then transported to the postanesthesia care unit in stable condition. All sponge instrument and needle counts were reportedly correct at the end of this case.

## 2012-12-25 NOTE — Anesthesia Postprocedure Evaluation (Signed)
  Anesthesia Post-op Note  Patient: Donna Alvarez  Procedure(s) Performed: Procedure(s) with comments: LUMBAR LAMINECTOMY/DECOMPRESSION MICRODISCECTOMY 1 LEVEL (Right) - RIGHT Lumbar five sacral one diskectomy  Patient Location: PACU  Anesthesia Type:General  Level of Consciousness: awake and sedated  Airway and Oxygen Therapy: Patient Spontanous Breathing  Post-op Pain: mild  Post-op Assessment: Post-op Vital signs reviewed  Post-op Vital Signs: stable  Complications: No apparent anesthesia complications

## 2012-12-25 NOTE — Progress Notes (Signed)
Pt. Alert and oriented,follows simple instructions, denies pain. Incision area without swelling, redness or S/S of infection. Voiding adequate clear yellow urine. Moving all extremities well and vitals stable and documented. Lumbar surgery notes instructions given to patient and family member for home safety and precautions. Pt. and family stated understanding of instructions given.  

## 2012-12-25 NOTE — Progress Notes (Signed)
Subjective:  The patient is alert and pleasant. She looks well. She is in no apparent distress.  Objective: Vital signs in last 24 hours: Temp:  [96.8 F (36 C)-98.2 F (36.8 C)] 96.8 F (36 C) (03/19 1047) Pulse Rate:  [59-87] 59 (03/19 1115) Resp:  [15-20] 15 (03/19 1115) BP: (114-148)/(53-80) 114/53 mmHg (03/19 1047) SpO2:  [97 %-100 %] 100 % (03/19 1115)  Intake/Output from previous day:   Intake/Output this shift: Total I/O In: 900 [I.V.:900] Out: 20 [Blood:20]  Physical exam patient is alert and pleasant. Her strength is grossly normal in her lower extremities.  Lab Results:  Recent Labs  12/23/12 1258  WBC 5.8  HGB 14.1  HCT 39.7  PLT 250   BMET  Recent Labs  12/23/12 1258  NA 142  K 3.5  CL 106  CO2 28  GLUCOSE 95  BUN 11  CREATININE 0.70  CALCIUM 10.3    Studies/Results: Dg Chest 2 View  12/23/2012  *RADIOLOGY REPORT*  Clinical Data: Preoperative respiratory films.  CHEST - 2 VIEW  Comparison: Report of chest CT scan 06/00 06/02 reviewed.  Images are not available.  Findings: Lungs are clear.  Heart size is normal.  No pneumothorax or pleural effusion.  Descending thoracic aortic aneurysm measures 4.3 cm, not markedly changed based on report of prior CT scan where it is reported to measure 3.9 cm.  IMPRESSION: No acute disease.   Original Report Authenticated By: Holley Dexter, M.D.    Dg Lumbar Spine 1 View  12/25/2012  *RADIOLOGY REPORT*  Clinical Data: Back pain  LUMBAR SPINE - 1 VIEW  Comparison: 12/09/2012  Findings: A portable cross-table lateral demonstrates a probe directed most closely at the L5-S1 interspace.  IMPRESSION: As above.   Original Report Authenticated By: Davonna Belling, M.D.     Assessment/Plan: The patient is doing well.  LOS: 0 days     Arnold Depinto D 12/25/2012, 11:22 AM

## 2012-12-25 NOTE — Anesthesia Preprocedure Evaluation (Addendum)
Anesthesia Evaluation  Patient identified by MRN, date of birth, ID band Patient awake    Reviewed: Allergy & Precautions, H&P , NPO status , Patient's Chart, lab work & pertinent test results  History of Anesthesia Complications Negative for: history of anesthetic complications  Airway Mallampati: II TM Distance: >3 FB Neck ROM: Limited    Dental  (+) Caps, Dental Advisory Given, Teeth Intact and Partial Upper,    Pulmonary neg pulmonary ROS,  breath sounds clear to auscultation        Cardiovascular hypertension, Pt. on medications + dysrhythmias Rhythm:Regular Rate:Normal     Neuro/Psych  Neuromuscular disease negative psych ROS   GI/Hepatic negative GI ROS,   Endo/Other  Hypothyroidism   Renal/GU negative Renal ROS     Musculoskeletal negative musculoskeletal ROS (+)   Abdominal   Peds  Hematology negative hematology ROS (+)   Anesthesia Other Findings   Reproductive/Obstetrics                        Anesthesia Physical Anesthesia Plan  ASA: II  Anesthesia Plan: General   Post-op Pain Management:    Induction: Intravenous  Airway Management Planned: Oral ETT  Additional Equipment:   Intra-op Plan:   Post-operative Plan: Extubation in OR  Informed Consent: I have reviewed the patients History and Physical, chart, labs and discussed the procedure including the risks, benefits and alternatives for the proposed anesthesia with the patient or authorized representative who has indicated his/her understanding and acceptance.   Dental advisory given  Plan Discussed with: CRNA and Surgeon  Anesthesia Plan Comments:         Anesthesia Quick Evaluation

## 2012-12-25 NOTE — Transfer of Care (Signed)
Immediate Anesthesia Transfer of Care Note  Patient: Donna Alvarez  Procedure(s) Performed: Procedure(s) with comments: LUMBAR LAMINECTOMY/DECOMPRESSION MICRODISCECTOMY 1 LEVEL (Right) - RIGHT Lumbar five sacral one diskectomy  Patient Location: PACU  Anesthesia Type:General  Level of Consciousness: awake, alert  and oriented  Airway & Oxygen Therapy: Patient Spontanous Breathing and Patient connected to nasal cannula oxygen  Post-op Assessment: Report given to PACU RN and Post -op Vital signs reviewed and stable  Post vital signs: Reviewed and stable  Complications: No apparent anesthesia complications

## 2012-12-26 ENCOUNTER — Encounter (HOSPITAL_COMMUNITY): Payer: Self-pay | Admitting: Neurosurgery

## 2012-12-27 NOTE — Progress Notes (Signed)
Utilization review completed. Hyman Crossan, RN, BSN. 

## 2013-02-26 ENCOUNTER — Other Ambulatory Visit: Payer: Self-pay | Admitting: Neurosurgery

## 2013-02-26 DIAGNOSIS — M542 Cervicalgia: Secondary | ICD-10-CM

## 2013-02-28 ENCOUNTER — Ambulatory Visit
Admission: RE | Admit: 2013-02-28 | Discharge: 2013-02-28 | Disposition: A | Discharge status: Home / Self Care | Payer: Medicare Other | Source: Ambulatory Visit | Attending: Neurosurgery | Admitting: Neurosurgery

## 2013-02-28 DIAGNOSIS — M542 Cervicalgia: Secondary | ICD-10-CM

## 2013-04-21 ENCOUNTER — Other Ambulatory Visit: Payer: Self-pay | Admitting: Neurosurgery

## 2013-04-21 DIAGNOSIS — M549 Dorsalgia, unspecified: Secondary | ICD-10-CM

## 2013-05-05 ENCOUNTER — Other Ambulatory Visit: Payer: Self-pay | Admitting: Neurosurgery

## 2013-05-05 ENCOUNTER — Ambulatory Visit
Admission: RE | Admit: 2013-05-05 | Discharge: 2013-05-05 | Disposition: A | Discharge status: Home / Self Care | Payer: Medicare Other | Source: Ambulatory Visit | Attending: Neurosurgery | Admitting: Neurosurgery

## 2013-05-05 DIAGNOSIS — M549 Dorsalgia, unspecified: Secondary | ICD-10-CM

## 2013-05-05 MED ORDER — GADOBENATE DIMEGLUMINE 529 MG/ML IV SOLN
9.0000 mL | Freq: Once | INTRAVENOUS | Status: AC | PRN
  Administered 2013-05-05: 9 mL via INTRAVENOUS

## 2013-05-09 ENCOUNTER — Other Ambulatory Visit: Payer: Medicare Other

## 2013-05-12 ENCOUNTER — Other Ambulatory Visit: Payer: Self-pay | Admitting: Neurosurgery

## 2013-05-22 ENCOUNTER — Encounter (HOSPITAL_COMMUNITY): Payer: Self-pay | Admitting: Pharmacy Technician

## 2013-05-22 NOTE — Pre-Procedure Instructions (Signed)
Donna Alvarez  05/22/2013   Your procedure is scheduled on:  May 29, 2013 at 10:30 AM   Report to [facility] at 7:30 AM.   Call this number if you have problems the morning of surgery: [phone]   Remember:   Do not eat food or drink liquids after midnight.    Take these medicines the morning of surgery with A SIP OF WATER: amLODipine (NORVASC), diazepam (VALIUM) - if needed,  gabapentin (NEURONTIN), levothyroxine (LEVOXYL), oxyCODONE-acetaminophen (ROXICET) - if needed  Discontinue Aspirin, Coumadin, Plavix, Effient and herbal medication 7 days prior to surgery.         Do not wear jewelry, make-up or nail polish.  Do not wear lotions, powders, or perfumes.   Do not shave 48 hours prior to surgery.   Do not bring valuables to the hospital.  [facility] is not responsible for any belongings or valuables.  Contacts, dentures or bridgework may not be worn into surgery.  Leave suitcase in the car. After surgery it may be brought to your room.   For patients admitted to the hospital, checkout time is 11:00 AM the day of discharge.    Special Instructions: Shower using CHG 2 nights before surgery and the night before surgery.  If you shower the day of surgery use CHG.  Use special wash - you have one bottle of CHG for all showers.  You should use approximately 1/3 of the bottle for each shower.   Please read over the following fact sheets that you were given: Pain Booklet, Coughing and Deep Breathing, MRSA Information and Surgical Site Infection Prevention

## 2013-05-23 ENCOUNTER — Encounter (HOSPITAL_COMMUNITY)
Admission: RE | Admit: 2013-05-23 | Discharge: 2013-05-23 | Disposition: A | Discharge status: Home / Self Care | Payer: Medicare Other | Source: Ambulatory Visit | Attending: Neurosurgery | Admitting: Neurosurgery

## 2013-05-23 ENCOUNTER — Encounter (HOSPITAL_COMMUNITY): Payer: Self-pay

## 2013-05-23 ENCOUNTER — Other Ambulatory Visit (HOSPITAL_COMMUNITY): Payer: Medicare Other

## 2013-05-23 DIAGNOSIS — Z01812 Encounter for preprocedural laboratory examination: Secondary | ICD-10-CM | POA: Insufficient documentation

## 2013-05-23 DIAGNOSIS — Z01818 Encounter for other preprocedural examination: Secondary | ICD-10-CM | POA: Insufficient documentation

## 2013-05-23 LAB — CBC
Hemoglobin: 13.8 g/dL (ref 12.0–15.0)
MCH: 31.2 pg (ref 26.0–34.0)
MCHC: 34.9 g/dL (ref 30.0–36.0)
Platelets: 272 10*3/uL (ref 150–400)
RDW: 12.8 % (ref 11.5–15.5)

## 2013-05-23 LAB — BASIC METABOLIC PANEL
BUN: 15 mg/dL (ref 6–23)
Calcium: 10.2 mg/dL (ref 8.4–10.5)
Creatinine, Ser: 0.81 mg/dL (ref 0.50–1.10)
GFR calc Af Amer: 85 mL/min — ABNORMAL LOW (ref >90)
GFR calc non Af Amer: 73 mL/min — ABNORMAL LOW (ref >90)
Glucose, Bld: 97 mg/dL (ref 70–99)
Potassium: 4 mEq/L (ref 3.5–5.1)

## 2013-05-28 MED ORDER — CEFAZOLIN SODIUM-DEXTROSE 2-3 GM-% IV SOLR
2.0000 g | INTRAVENOUS | Status: AC
  Administered 2013-05-29: 2 g via INTRAVENOUS
  Filled 2013-05-28: qty 50

## 2013-05-29 ENCOUNTER — Ambulatory Visit (HOSPITAL_COMMUNITY): Payer: Medicare Other | Admitting: Anesthesiology

## 2013-05-29 ENCOUNTER — Ambulatory Visit (HOSPITAL_COMMUNITY): Payer: Medicare Other

## 2013-05-29 ENCOUNTER — Encounter (HOSPITAL_COMMUNITY): Payer: Self-pay | Admitting: Anesthesiology

## 2013-05-29 ENCOUNTER — Ambulatory Visit (HOSPITAL_COMMUNITY)
Admission: RE | Admit: 2013-05-29 | Discharge: 2013-05-30 | DRG: 491 | Disposition: A | Discharge status: Home / Self Care | Payer: Medicare Other | Source: Ambulatory Visit | Attending: Neurosurgery | Admitting: Neurosurgery

## 2013-05-29 ENCOUNTER — Encounter (HOSPITAL_COMMUNITY)
Admission: RE | Disposition: A | Discharge status: Home / Self Care | Payer: Self-pay | Source: Ambulatory Visit | Attending: Neurosurgery

## 2013-05-29 ENCOUNTER — Encounter (HOSPITAL_COMMUNITY): Payer: Self-pay | Admitting: *Deleted

## 2013-05-29 DIAGNOSIS — I10 Essential (primary) hypertension: Secondary | ICD-10-CM | POA: Insufficient documentation

## 2013-05-29 DIAGNOSIS — M5126 Other intervertebral disc displacement, lumbar region: Secondary | ICD-10-CM | POA: Insufficient documentation

## 2013-05-29 DIAGNOSIS — Z79899 Other long term (current) drug therapy: Secondary | ICD-10-CM | POA: Insufficient documentation

## 2013-05-29 HISTORY — PX: LUMBAR LAMINECTOMY/DECOMPRESSION MICRODISCECTOMY: SHX5026

## 2013-05-29 LAB — SURGICAL PCR SCREEN
MRSA, PCR: NEGATIVE
Staphylococcus aureus: NEGATIVE

## 2013-05-29 SURGERY — LUMBAR LAMINECTOMY/DECOMPRESSION MICRODISCECTOMY 1 LEVEL
Anesthesia: General | Site: Spine Lumbar | Laterality: Right | Wound class: Clean

## 2013-05-29 MED ORDER — LACTATED RINGERS IV SOLN
INTRAVENOUS | Status: DC
  Administered 2013-05-29: 09:00:00 via INTRAVENOUS

## 2013-05-29 MED ORDER — PRAMIPEXOLE DIHYDROCHLORIDE 1.5 MG PO TABS
1.5000 mg | ORAL_TABLET | Freq: Every day | ORAL | Status: DC
  Administered 2013-05-29: 1.5 mg via ORAL
  Filled 2013-05-29 (×2): qty 1

## 2013-05-29 MED ORDER — IBUPROFEN 400 MG PO TABS
400.0000 mg | ORAL_TABLET | Freq: Four times a day (QID) | ORAL | Status: DC | PRN
  Filled 2013-05-29: qty 1

## 2013-05-29 MED ORDER — OXYCODONE HCL 5 MG PO TABS
5.0000 mg | ORAL_TABLET | Freq: Once | ORAL | Status: AC | PRN
  Administered 2013-05-29: 5 mg via ORAL

## 2013-05-29 MED ORDER — LEVOTHYROXINE SODIUM 50 MCG PO TABS
50.0000 ug | ORAL_TABLET | Freq: Every day | ORAL | Status: DC
  Administered 2013-05-30: 50 ug via ORAL
  Filled 2013-05-29 (×2): qty 1

## 2013-05-29 MED ORDER — MEPERIDINE HCL 25 MG/ML IJ SOLN: 6.2500 mg | INTRAMUSCULAR | Status: DC | PRN

## 2013-05-29 MED ORDER — OXYCODONE HCL 5 MG PO TABS
ORAL_TABLET | ORAL | Status: AC
  Filled 2013-05-29: qty 1

## 2013-05-29 MED ORDER — 0.9 % SODIUM CHLORIDE (POUR BTL) OPTIME
TOPICAL | Status: DC | PRN
  Administered 2013-05-29: 1000 mL

## 2013-05-29 MED ORDER — FENTANYL CITRATE 0.05 MG/ML IJ SOLN
INTRAMUSCULAR | Status: DC | PRN
  Administered 2013-05-29: 100 ug via INTRAVENOUS
  Administered 2013-05-29: 200 ug via INTRAVENOUS
  Administered 2013-05-29: 50 ug via INTRAVENOUS

## 2013-05-29 MED ORDER — SODIUM CHLORIDE 0.9 % IR SOLN
Status: DC | PRN
  Administered 2013-05-29: 12:00:00

## 2013-05-29 MED ORDER — THROMBIN 5000 UNITS EX SOLR
CUTANEOUS | Status: DC | PRN
  Administered 2013-05-29 (×2): 5000 [IU] via TOPICAL

## 2013-05-29 MED ORDER — POTASSIUM CHLORIDE CRYS ER 10 MEQ PO TBCR
10.0000 meq | EXTENDED_RELEASE_TABLET | Freq: Every day | ORAL | Status: DC
  Administered 2013-05-29 – 2013-05-30 (×2): 10 meq via ORAL
  Filled 2013-05-29 (×2): qty 1

## 2013-05-29 MED ORDER — ONDANSETRON HCL 4 MG/2ML IJ SOLN
INTRAMUSCULAR | Status: DC | PRN
  Administered 2013-05-29: 4 mg via INTRAVENOUS

## 2013-05-29 MED ORDER — NEOSTIGMINE METHYLSULFATE 1 MG/ML IJ SOLN
INTRAMUSCULAR | Status: DC | PRN
  Administered 2013-05-29: 3 mg via INTRAVENOUS

## 2013-05-29 MED ORDER — LIDOCAINE HCL (CARDIAC) 20 MG/ML IV SOLN
INTRAVENOUS | Status: DC | PRN
  Administered 2013-05-29: 50 mg via INTRAVENOUS

## 2013-05-29 MED ORDER — AMLODIPINE BESYLATE 2.5 MG PO TABS
2.5000 mg | ORAL_TABLET | Freq: Every day | ORAL | Status: DC
  Administered 2013-05-30: 2.5 mg via ORAL
  Filled 2013-05-29 (×2): qty 1

## 2013-05-29 MED ORDER — MENTHOL 3 MG MT LOZG: 1.0000 | LOZENGE | OROMUCOSAL | Status: DC | PRN

## 2013-05-29 MED ORDER — FUROSEMIDE 20 MG PO TABS
20.0000 mg | ORAL_TABLET | Freq: Every day | ORAL | Status: DC
  Administered 2013-05-30: 20 mg via ORAL
  Filled 2013-05-29 (×2): qty 1

## 2013-05-29 MED ORDER — ACETAMINOPHEN 650 MG RE SUPP: 650.0000 mg | RECTAL | Status: DC | PRN

## 2013-05-29 MED ORDER — DOCUSATE SODIUM 100 MG PO CAPS
100.0000 mg | ORAL_CAPSULE | Freq: Two times a day (BID) | ORAL | Status: DC
  Administered 2013-05-29 – 2013-05-30 (×3): 100 mg via ORAL
  Filled 2013-05-29 (×3): qty 1

## 2013-05-29 MED ORDER — BUPIVACAINE-EPINEPHRINE PF 0.5-1:200000 % IJ SOLN
INTRAMUSCULAR | Status: DC | PRN
  Administered 2013-05-29: 10 mL

## 2013-05-29 MED ORDER — MUPIROCIN 2 % EX OINT
TOPICAL_OINTMENT | CUTANEOUS | Status: AC
  Administered 2013-05-29: 09:00:00
  Filled 2013-05-29: qty 22

## 2013-05-29 MED ORDER — ONDANSETRON HCL 4 MG/2ML IJ SOLN: 4.0000 mg | INTRAMUSCULAR | Status: DC | PRN

## 2013-05-29 MED ORDER — BACITRACIN ZINC 500 UNIT/GM EX OINT
TOPICAL_OINTMENT | CUTANEOUS | Status: DC | PRN
  Administered 2013-05-29: 1 via TOPICAL

## 2013-05-29 MED ORDER — OXYCODONE HCL 5 MG/5ML PO SOLN: 5.0000 mg | Freq: Once | ORAL | Status: AC | PRN

## 2013-05-29 MED ORDER — HYDROMORPHONE HCL PF 1 MG/ML IJ SOLN
0.2500 mg | INTRAMUSCULAR | Status: DC | PRN
  Administered 2013-05-29 (×3): 0.5 mg via INTRAVENOUS

## 2013-05-29 MED ORDER — HYDROCODONE-ACETAMINOPHEN 5-325 MG PO TABS: 1.0000 | ORAL_TABLET | ORAL | Status: DC | PRN

## 2013-05-29 MED ORDER — CEFAZOLIN SODIUM-DEXTROSE 2-3 GM-% IV SOLR
2.0000 g | Freq: Three times a day (TID) | INTRAVENOUS | Status: AC
  Administered 2013-05-29 (×2): 2 g via INTRAVENOUS
  Filled 2013-05-29 (×2): qty 50

## 2013-05-29 MED ORDER — MIDAZOLAM HCL 2 MG/2ML IJ SOLN: 0.5000 mg | Freq: Once | INTRAMUSCULAR | Status: DC | PRN

## 2013-05-29 MED ORDER — GABAPENTIN 300 MG PO CAPS
300.0000 mg | ORAL_CAPSULE | Freq: Three times a day (TID) | ORAL | Status: DC
  Administered 2013-05-29 – 2013-05-30 (×3): 300 mg via ORAL
  Filled 2013-05-29 (×5): qty 1

## 2013-05-29 MED ORDER — MIDAZOLAM HCL 5 MG/5ML IJ SOLN
INTRAMUSCULAR | Status: DC | PRN
  Administered 2013-05-29: 1 mg via INTRAVENOUS

## 2013-05-29 MED ORDER — LACTATED RINGERS IV SOLN
INTRAVENOUS | Status: DC | PRN
  Administered 2013-05-29 (×2): via INTRAVENOUS

## 2013-05-29 MED ORDER — DIAZEPAM 5 MG PO TABS
5.0000 mg | ORAL_TABLET | Freq: Four times a day (QID) | ORAL | Status: DC | PRN
  Administered 2013-05-29 – 2013-05-30 (×2): 5 mg via ORAL
  Filled 2013-05-29: qty 1

## 2013-05-29 MED ORDER — PROMETHAZINE HCL 25 MG/ML IJ SOLN: 6.2500 mg | INTRAMUSCULAR | Status: DC | PRN

## 2013-05-29 MED ORDER — MORPHINE SULFATE 2 MG/ML IJ SOLN
1.0000 mg | INTRAMUSCULAR | Status: DC | PRN
  Administered 2013-05-29 – 2013-05-30 (×2): 2 mg via INTRAVENOUS
  Filled 2013-05-29 (×2): qty 1

## 2013-05-29 MED ORDER — GLYCOPYRROLATE 0.2 MG/ML IJ SOLN
INTRAMUSCULAR | Status: DC | PRN
  Administered 2013-05-29: .5 mg via INTRAVENOUS

## 2013-05-29 MED ORDER — PROPOFOL 10 MG/ML IV BOLUS
INTRAVENOUS | Status: DC | PRN
  Administered 2013-05-29: 140 mg via INTRAVENOUS

## 2013-05-29 MED ORDER — OXYCODONE-ACETAMINOPHEN 5-325 MG PO TABS
1.0000 | ORAL_TABLET | ORAL | Status: DC | PRN
Start: 2013-05-29 — End: 2013-05-30
  Administered 2013-05-29 – 2013-05-30 (×3): 2 via ORAL
  Filled 2013-05-29 (×3): qty 2

## 2013-05-29 MED ORDER — ALUM & MAG HYDROXIDE-SIMETH 200-200-20 MG/5ML PO SUSP: 30.0000 mL | Freq: Four times a day (QID) | ORAL | Status: DC | PRN

## 2013-05-29 MED ORDER — HYDROMORPHONE HCL PF 1 MG/ML IJ SOLN
INTRAMUSCULAR | Status: AC
  Filled 2013-05-29: qty 1

## 2013-05-29 MED ORDER — LACTATED RINGERS IV SOLN: INTRAVENOUS | Status: DC

## 2013-05-29 MED ORDER — DIAZEPAM 5 MG PO TABS
5.0000 mg | ORAL_TABLET | Freq: Four times a day (QID) | ORAL | Status: DC | PRN
  Filled 2013-05-29: qty 1

## 2013-05-29 MED ORDER — PHENOL 1.4 % MT LIQD: 1.0000 | OROMUCOSAL | Status: DC | PRN

## 2013-05-29 MED ORDER — ROCURONIUM BROMIDE 100 MG/10ML IV SOLN
INTRAVENOUS | Status: DC | PRN
  Administered 2013-05-29: 50 mg via INTRAVENOUS

## 2013-05-29 MED ORDER — ACETAMINOPHEN 325 MG PO TABS: 650.0000 mg | ORAL_TABLET | ORAL | Status: DC | PRN

## 2013-05-29 MED ORDER — HEMOSTATIC AGENTS (NO CHARGE) OPTIME
TOPICAL | Status: DC | PRN
  Administered 2013-05-29: 1 via TOPICAL

## 2013-05-29 SURGICAL SUPPLY — 59 items
BAG DECANTER FOR FLEXI CONT (MISCELLANEOUS) ×2 IMPLANT
BENZOIN TINCTURE PRP APPL 2/3 (GAUZE/BANDAGES/DRESSINGS) ×2 IMPLANT
BLADE SURG ROTATE 9660 (MISCELLANEOUS) IMPLANT
BRUSH SCRUB EZ PLAIN DRY (MISCELLANEOUS) ×2 IMPLANT
BUR ACORN 6.0 (BURR) ×2 IMPLANT
BUR MATCHSTICK NEURO 3.0 LAGG (BURR) ×2 IMPLANT
CANISTER SUCTION 2500CC (MISCELLANEOUS) ×2 IMPLANT
CLOTH BEACON ORANGE TIMEOUT ST (SAFETY) ×2 IMPLANT
CONT SPEC 4OZ CLIKSEAL STRL BL (MISCELLANEOUS) ×2 IMPLANT
DRAPE LAPAROTOMY 100X72X124 (DRAPES) ×2 IMPLANT
DRAPE MICROSCOPE LEICA (MISCELLANEOUS) ×2 IMPLANT
DRAPE POUCH INSTRU U-SHP 10X18 (DRAPES) ×2 IMPLANT
DRAPE SURG 17X23 STRL (DRAPES) ×8 IMPLANT
DURASEAL APPLICATOR TIP (TIP) ×2 IMPLANT
DURASEAL SPINE SEALANT 3ML (MISCELLANEOUS) ×2 IMPLANT
ELECT BLADE 4.0 EZ CLEAN MEGAD (MISCELLANEOUS) ×2
ELECT REM PT RETURN 9FT ADLT (ELECTROSURGICAL) ×2
ELECTRODE BLDE 4.0 EZ CLN MEGD (MISCELLANEOUS) ×1 IMPLANT
ELECTRODE REM PT RTRN 9FT ADLT (ELECTROSURGICAL) ×1 IMPLANT
GAUZE SPONGE 4X4 16PLY XRAY LF (GAUZE/BANDAGES/DRESSINGS) IMPLANT
GLOVE BIO SURGEON STRL SZ8.5 (GLOVE) ×2 IMPLANT
GLOVE BIOGEL PI IND STRL 7.0 (GLOVE) ×1 IMPLANT
GLOVE BIOGEL PI IND STRL 7.5 (GLOVE) ×2 IMPLANT
GLOVE BIOGEL PI INDICATOR 7.0 (GLOVE) ×1
GLOVE BIOGEL PI INDICATOR 7.5 (GLOVE) ×2
GLOVE ECLIPSE 7.5 STRL STRAW (GLOVE) ×2 IMPLANT
GLOVE ECLIPSE 8.0 STRL XLNG CF (GLOVE) ×2 IMPLANT
GLOVE EXAM NITRILE LRG STRL (GLOVE) IMPLANT
GLOVE EXAM NITRILE MD LF STRL (GLOVE) IMPLANT
GLOVE EXAM NITRILE XL STR (GLOVE) IMPLANT
GLOVE EXAM NITRILE XS STR PU (GLOVE) IMPLANT
GLOVE SS BIOGEL STRL SZ 8 (GLOVE) ×1 IMPLANT
GLOVE SUPERSENSE BIOGEL SZ 8 (GLOVE) ×1
GLOVE SURG SS PI 7.0 STRL IVOR (GLOVE) ×2 IMPLANT
GOWN BRE IMP SLV AUR LG STRL (GOWN DISPOSABLE) IMPLANT
GOWN BRE IMP SLV AUR XL STRL (GOWN DISPOSABLE) ×8 IMPLANT
GOWN STRL REIN 2XL LVL4 (GOWN DISPOSABLE) IMPLANT
KIT BASIN OR (CUSTOM PROCEDURE TRAY) ×2 IMPLANT
KIT ROOM TURNOVER OR (KITS) ×2 IMPLANT
NEEDLE HYPO 21X1.5 SAFETY (NEEDLE) IMPLANT
NEEDLE HYPO 22GX1.5 SAFETY (NEEDLE) ×2 IMPLANT
NS IRRIG 1000ML POUR BTL (IV SOLUTION) ×2 IMPLANT
PACK LAMINECTOMY NEURO (CUSTOM PROCEDURE TRAY) ×2 IMPLANT
PAD ARMBOARD 7.5X6 YLW CONV (MISCELLANEOUS) ×14 IMPLANT
PATTIES SURGICAL .5 X1 (DISPOSABLE) IMPLANT
RUBBERBAND STERILE (MISCELLANEOUS) ×4 IMPLANT
SLEEVE SURGEON STRL (DRAPES) ×2 IMPLANT
SPONGE GAUZE 4X4 12PLY (GAUZE/BANDAGES/DRESSINGS) ×2 IMPLANT
SPONGE SURGIFOAM ABS GEL SZ50 (HEMOSTASIS) ×2 IMPLANT
STRIP CLOSURE SKIN 1/2X4 (GAUZE/BANDAGES/DRESSINGS) ×2 IMPLANT
SUT VIC AB 1 CT1 18XBRD ANBCTR (SUTURE) ×1 IMPLANT
SUT VIC AB 1 CT1 8-18 (SUTURE) ×1
SUT VIC AB 2-0 CP2 18 (SUTURE) ×2 IMPLANT
SYR 20CC LL (SYRINGE) IMPLANT
SYR 20ML ECCENTRIC (SYRINGE) ×2 IMPLANT
TAPE CLOTH SURG 4X10 WHT LF (GAUZE/BANDAGES/DRESSINGS) ×2 IMPLANT
TOWEL OR 17X24 6PK STRL BLUE (TOWEL DISPOSABLE) ×2 IMPLANT
TOWEL OR 17X26 10 PK STRL BLUE (TOWEL DISPOSABLE) ×2 IMPLANT
WATER STERILE IRR 1000ML POUR (IV SOLUTION) ×2 IMPLANT

## 2013-05-29 NOTE — Anesthesia Postprocedure Evaluation (Signed)
  Anesthesia Post-op Note  Patient: Donna Alvarez  Procedure(s) Performed: Procedure(s) with comments: LUMBAR LAMINECTOMY/DECOMPRESSION MICRODISCECTOMY 1 LEVEL,RIGHT LUMBAR FIVE-SACRAL ONE (Right) - RIGHT  Patient Location: PACU  Anesthesia Type:General  Level of Consciousness: awake, alert , oriented and patient cooperative  Airway and Oxygen Therapy: Patient Spontanous Breathing and Patient connected to nasal cannula oxygen  Post-op Pain: none  Post-op Assessment: Post-op Vital signs reviewed, Patient's Cardiovascular Status Stable, Respiratory Function Stable, Patent Airway, No signs of Nausea or vomiting and Pain level controlled  Post-op Vital Signs: Reviewed and stable  Complications: No apparent anesthesia complications

## 2013-05-29 NOTE — H&P (Signed)
Subjective: The patient is a 67 year old white female who has complained of back and right leg pain consistent with a lumbar radiculopathy. She has failed medical management and was worked up with a lumbar MRI. This demonstrated a herniated disc at L5-S1 on the right. I discussed the various treatment options with patient including surgery. She has weighed the risks, benefits, and alternatives surgery and decided proceed with a right L5-S1 discectomy.   Past Medical History  Diagnosis Date  . Thyroid disease   . Hernia   . Hypertension   . Dysrhythmia   . Hypothyroidism   . Chronic kidney disease     kidney stones - 2013- treated /w surg. procedure   . 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 Arthritis     hands, +low back   . Complication of anesthesia     with anesth. 10/2010- very emotional & slow to wake up, 12/2010- no problems  . Neuromuscular disorder     uses Mirapex for RLS    Past Surgical History  Procedure Laterality Date  . Torn cartilage  2012  . Childbirth      x2   . Knee arthroscopy  10/2010    L  . Hernia repair      12/2010- both inguinal & umbilical done at same time  . Melanoma excision      posterior area of R leg.  . Cystoscopy      for stone removal   . Lumbar laminectomy/decompression microdiscectomy Right 12/25/2012    Procedure: LUMBAR LAMINECTOMY/DECOMPRESSION MICRODISCECTOMY 1 LEVEL;  Surgeon: Cristi Loron, MD;  Location: [facility];  Service: Neurosurgery;  Laterality: Right;  RIGHT Lumbar five sacral one diskectomy    No Known Allergies  History  Substance Use Topics  . Smoking status: Never Smoker   . Smokeless tobacco: Not on file  . Alcohol Use: No    Family History  Problem Relation Age of Onset  . Cancer Father   . Hypertension Father    Prior to Admission medications   Medication Sig Start Date End Date Taking? Authorizing Provider  amLODipine (NORVASC) 2.5 MG tablet Take 2.5 mg by mouth daily after  breakfast.    Yes Historical Provider, MD  calcitonin, salmon, (MIACALCIN/FORTICAL) 200 UNIT/ACT nasal spray Place 1 spray into the nose daily.   Yes Historical Provider, MD  furosemide (LASIX) 20 MG tablet Take 20 mg by mouth daily after breakfast.    Yes Historical Provider, MD  gabapentin (NEURONTIN) 300 MG capsule Take 300 mg by mouth 3 (three) times daily.    Yes Historical Provider, MD  ibuprofen (ADVIL,MOTRIN) 200 MG tablet Take 400 mg by mouth every 6 (six) hours as needed for pain.   Yes Historical Provider, MD  levothyroxine (LEVOXYL) 50 MCG tablet Take 50 mcg by mouth daily before breakfast.    Yes Historical Provider, MD  oxyCODONE-acetaminophen (ROXICET) 5-325 MG per tablet Take 1 tablet by mouth every 6 (six) hours as needed for pain. 12/25/12  Yes Carmela Hurt, MD  potassium chloride (MICRO-K) 10 MEQ CR capsule Take 10 mEq by mouth daily.   Yes Historical Provider, MD  pramipexole (MIRAPEX) 1.5 MG tablet Take 1.5 mg by mouth at bedtime.    Yes Historical Provider, MD  Cholecalciferol 3000 UNITS TABS Take 3,000 Units by mouth daily.    Historical Provider, MD  diazepam (VALIUM) 5 MG tablet Take 1 tablet (5 mg total) by mouth every 6 (  six) hours as needed for anxiety (muscle spasms). 12/25/12   Carmela Hurt, MD  KRILL OIL PO Take 1 capsule by mouth daily. Hold while in hospital    Historical Provider, MD     Review of Systems  Positive ROS: As above  All other systems have been reviewed and were otherwise negative with the exception of those mentioned in the HPI and as above.  Objective: Vital signs in last 24 hours: Temp:  [98.4 F (36.9 C)] 98.4 F (36.9 C) (08/21 0808) Pulse Rate:  [64] 64 (08/21 0808) Resp:  [20] 20 (08/21 0808) BP: (127)/(64) 127/64 mmHg (08/21 0808) SpO2:  [98 %] 98 % (08/21 0808)  General Appearance: Alert, cooperative, no distress, appears stated age Head: Normocephalic, without obvious abnormality, atraumatic Eyes: PERRL, conjunctiva/corneas  clear, EOM's intact, fundi benign, both eyes      Ears: Normal TM's and external ear canals, both ears Throat: Lips, mucosa, and tongue normal; teeth and gums normal Neck: Supple, symmetrical, trachea midline, no adenopathy; thyroid: No enlargement/tenderness/nodules; no carotid bruit or JVD Back: Symmetric, no curvature, ROM normal, no CVA tenderness Lungs: Clear to auscultation bilaterally, respirations unlabored Heart: Regular rate and rhythm, S1 and S2 normal, no murmur, rub or gallop Abdomen: Soft, non-tender, bowel sounds active all four quadrants, no masses, no organomegaly Extremities: Extremities normal, atraumatic, no cyanosis or edema Pulses: 2+ and symmetric all extremities Skin: Skin color, texture, turgor normal, no rashes or lesions  NEUROLOGIC:   Mental status: alert and oriented, no aphasia, good attention span, Fund of knowledge/ memory ok Motor Exam - grossly normal Sensory Exam - grossly normal Reflexes:  Coordination - grossly normal Gait - grossly normal Balance - grossly normal Cranial Nerves: I: smell Not tested  II: visual acuity  OS: Normal    OD: Normal   II: visual fields Full to confrontation  II: pupils Equal, round, reactive to light  III,VII: ptosis None  III,IV,VI: extraocular muscles  Full ROM  V: mastication Normal  V: facial light touch sensation  Normal  V,VII: corneal reflex  Present  VII: facial muscle function - upper  Normal  VII: facial muscle function - lower Normal  VIII: hearing Not tested  IX: soft palate elevation  Normal  IX,X: gag reflex Present  XI: trapezius strength  5/5  XI: sternocleidomastoid strength 5/5  XI: neck flexion strength  5/5  XII: tongue strength  Normal    Data Review Lab Results  Component Value Date   WBC 4.5 05/23/2013   HGB 13.8 05/23/2013   HCT 39.5 05/23/2013   MCV 89.4 05/23/2013   PLT 272 05/23/2013   Lab Results  Component Value Date   NA 140 05/23/2013   K 4.0 05/23/2013   CL 104 05/23/2013    CO2 27 05/23/2013   BUN 15 05/23/2013   CREATININE 0.81 05/23/2013   GLUCOSE 97 05/23/2013   No results found for this basename: INR, PROTIME    Assessment/Plan: Right L5-S1 herniated disc, lumbago, lumbar radiculopathy. I discussed situation with the patient. I reviewed her MR scan with her and pointed out the abnormalities. We have discussed the various treatment options including surgery. I described the surgical treatment option of a right L5-S1 discectomy. I described the surgery to her. I have shown her surgical models. We have discussed the risks, benefits, alternatives, and likelihood of achieving our goals with surgery. I've answered all the patient's questions. She wants to proceed with surgery.   Ademola Vert D 05/29/2013 9:44 AM

## 2013-05-29 NOTE — Transfer of Care (Signed)
Immediate Anesthesia Transfer of Care Note  Patient: Donna Alvarez  Procedure(s) Performed: Procedure(s) with comments: LUMBAR LAMINECTOMY/DECOMPRESSION MICRODISCECTOMY 1 LEVEL,RIGHT LUMBAR FIVE-SACRAL ONE (Right) - RIGHT  Patient Location: PACU  Anesthesia Type:General  Level of Consciousness: sedated  Airway & Oxygen Therapy: Patient Spontanous Breathing and Patient connected to nasal cannula oxygen  Post-op Assessment: Report given to PACU RN and Post -op Vital signs reviewed and stable  Post vital signs: Reviewed and stable  Complications: No apparent anesthesia complications

## 2013-05-29 NOTE — Op Note (Signed)
Brief history: The patient is a 67 year old white female who I performed a L5-S1 discectomy on about 5 months ago. She had done well for months but then developed recurrent back and right leg pain. She failed medical management and was worked up with a lumbar MRI. This demonstrated a large recurrent ruptured disc at L5-S1 on the right. I discussed the various treatment options with the patient including surgery. She has weighed the risks, benefits, and alternatives surgery and decided proceed with a redo right L5-S1 discectomy.  Preoperative diagnosis: Recurrent right L5-S1 herniated disc, spinal stenosis, lumbar radiculopathy, lumbago  Postoperative diagnosis: The same  Procedure: Redo right L5-S1 Intervertebral discectomy using micro-dissection  Surgeon: Dr. Delma Officer  Asst.: Dr. Aliene Beams  Anesthesia: Gen. endotracheal  Estimated blood loss: 50 cc  Drains: None  Complications: None  Description of procedure: The patient was brought to the operating room by the anesthesia team. General endotracheal anesthesia was induced. The patient was turned to the prone position on the Wilson frame. The patient's lumbosacral region was then prepared with Betadine scrub and Betadine solution. Sterile drapes were applied.  I then injected the area to be incised with Marcaine with epinephrine solution. I then used a scalpel to make a linear midline incision over the L5-S1 intervertebral disc space, incising through the patient's prior surgical scar. I then used electrocautery to perform a rate sided subperiosteal dissection, dissecting through scar tissue, exposing the spinous process and lamina of L5 and the upper sacrum. We obtained intraoperative radiograph to confirm our location. I then inserted the [facility] retractor for exposure.  We then brought the operative microscope into the field. Under its magnification and illumination we completed the microdissection. I used a high-speed drill to  widen the prior right L5 laminotomy. We drilled until we encountered relatively non-scarred dura.. I then used a Kerrison punches to widen the laminotomy and remove the epidural scar tissue. We then used microdissection to free up the thecal sac and the right S1 nerve root from the epidural scar tissue. I then used a Kerrison punch to perform a foraminotomy at about the right S1 nerve root. We then using the nerve root retractor to gently retract the thecal sac and the right S1 nerve root medially. This exposed the large recurrent intervertebral disc. We freed up the ruptured disc using microdissection and remove it with the pituitary forceps. We then performed a partial intervertebral discectomy. The disc space was quite spondylotic and collapsed  I then palpated along the ventral surface of the thecal sac and along exit route of the right S1 nerve root and noted that the neural structures were well decompressed. This completed the decompression.  We then obtained hemostasis using bipolar electrocautery. We irrigated the wound out with bacitracin solution. We then removed the retractor. We then reapproximated the patient's thoracolumbar fascia with interrupted #1 Vicryl suture. We then reapproximated the patient's subcutaneous tissue with interrupted 3-0 Vicryl suture. We then reapproximated patient's skin with Steri-Strips and benzoin. The was then coated with bacitracin ointment. The drapes were removed. The patient was subsequently returned to the supine position where they were extubated by the anesthesia team. The patient was then transported to the postanesthesia care unit in stable condition. All sponge instrument and needle counts were reportedly correct at the end of this case.

## 2013-05-29 NOTE — Progress Notes (Signed)
Patient ID: Donna Alvarez, female   DOB: 1946-08-23, 67 y.o.   MRN: [id number] Subjective:  The patient is somnolent but easily arousable. She is in no apparent distress.  Objective: Vital signs in last 24 hours: Temp:  [97.8 F (36.6 C)-98.4 F (36.9 C)] 97.8 F (36.6 C) (08/21 1300) Pulse Rate:  [64-74] 74 (08/21 1300) Resp:  [20] 20 (08/21 1300) BP: (118-127)/(54-64) 118/54 mmHg (08/21 1300) SpO2:  [98 %-99 %] 99 % (08/21 1300)  Intake/Output from previous day:   Intake/Output this shift: Total I/O In: 1150 [I.V.:1150] Out: -   Physical exam patient is Glasgow Coma Scale 12. She is moving all 4 extremities.  Lab Results: No results found for this basename: WBC, HGB, HCT, PLT,  in the last 72 hours BMET No results found for this basename: NA, K, CL, CO2, GLUCOSE, BUN, CREATININE, CALCIUM,  in the last 72 hours  Studies/Results: Dg Lumbar Spine 1 View  05/29/2013   *RADIOLOGY REPORT*  Clinical Data: L5-S1 laminectomy.  LUMBAR SPINE - 1 VIEW  Comparison: 05/05/2013 MR.  Findings: Single intraoperative lateral view submitted for review after surgery.  This reveals metallic probe posterior to the L5-S1 disc space.  Surgical sponge in place.  IMPRESSION: Localization L5-S1.   Original Report Authenticated By: Lacy Duverney, M.D.    Assessment/Plan: The patient seems to be doing well.  LOS: 0 days     Qunicy Higinbotham D 05/29/2013, 1:30 PM

## 2013-05-29 NOTE — Anesthesia Procedure Notes (Signed)
Procedure Name: Intubation Date/Time: 05/29/2013 11:21 AM Performed by: Gwenyth Allegra Pre-anesthesia Checklist: Emergency Drugs available, Timeout performed, Patient identified, Suction available and Patient being monitored Patient Re-evaluated:Patient Re-evaluated prior to inductionOxygen Delivery Method: Circle system utilized Preoxygenation: Pre-oxygenation with 100% oxygen Intubation Type: IV induction Ventilation: Mask ventilation without difficulty Laryngoscope Size: Mac and 3 Grade View: Grade II Tube type: Oral Tube size: 7.0 mm Number of attempts: 1 Airway Equipment and Method: Bougie stylet Secured at: 21 cm Tube secured with: Tape Dental Injury: Teeth and Oropharynx as per pre-operative assessment and Injury to lip  Difficulty Due To: Difficult Airway- due to anterior larynx and Difficult Airway- due to limited oral opening

## 2013-05-29 NOTE — Anesthesia Preprocedure Evaluation (Addendum)
Anesthesia Evaluation  Patient identified by MRN, date of birth, ID band Patient awake    Reviewed: Allergy & Precautions, H&P , NPO status , Patient's Chart, lab work & pertinent test results  History of Anesthesia Complications Negative for: history of anesthetic complications (patient had prolonged emergence on one occasion )  Airway Mallampati: II TM Distance: >3 FB Neck ROM: Full    Dental  (+) Poor Dentition, Missing, Partial Upper and Dental Advisory Given   Pulmonary neg pulmonary ROS,  breath sounds clear to auscultation  Pulmonary exam normal       Cardiovascular hypertension, Pt. 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, patient states unchanged on recent films at [facility]) Rhythm:Regular Rate:Normal     Neuro/Psych Chronic back pain: narcotics daily    GI/Hepatic negative GI ROS, Neg liver ROS,   Endo/Other  Hypothyroidism   Renal/GU Renal diseasenegative Renal ROS     Musculoskeletal   Abdominal   Peds  Hematology negative hematology ROS (+)   Anesthesia Other Findings   Reproductive/Obstetrics                         Anesthesia Physical Anesthesia Plan  ASA: III  Anesthesia Plan: General   Post-op Pain Management:    Induction: Intravenous  Airway Management Planned: Oral ETT  Additional Equipment:   Intra-op Plan:   Post-operative Plan: Extubation in OR  Informed Consent: I have reviewed the patients History and Physical, chart, labs and discussed the procedure including the risks, benefits and alternatives for the proposed anesthesia with the patient or authorized representative who has indicated his/her understanding and acceptance.   Dental advisory given  Plan Discussed with: CRNA and Surgeon  Anesthesia Plan Comments: (Plan routine monitors, GETA)        Anesthesia Quick Evaluation

## 2013-05-29 NOTE — Preoperative (Signed)
Beta Blockers   Reason not to administer Beta Blockers:Not Applicable 

## 2013-05-30 ENCOUNTER — Encounter (HOSPITAL_COMMUNITY): Payer: Self-pay | Admitting: Neurosurgery

## 2013-05-30 MED ORDER — DSS 100 MG PO CAPS: 100.0000 mg | ORAL_CAPSULE | Freq: Two times a day (BID) | ORAL | Status: DC

## 2013-05-30 MED ORDER — DIAZEPAM 5 MG PO TABS: 5.0000 mg | ORAL_TABLET | Freq: Four times a day (QID) | ORAL | Status: DC | PRN

## 2013-05-30 MED ORDER — OXYCODONE-ACETAMINOPHEN 10-325 MG PO TABS: 1.0000 | ORAL_TABLET | ORAL | Status: DC | PRN

## 2013-05-30 NOTE — Discharge Summary (Signed)
Physician Discharge Summary  Patient ID: Donna Alvarez MRN: [id number] DOB/AGE: 69-Oct-1947 69 y.o.  Admit date: 05/29/2013 Discharge date: 05/30/2013  Admission Diagnoses: Recurrent right L5-S1 herniated disc, lumbago, lumbar radiculopathy  Discharge Diagnoses: The same Active Problems:   * No active hospital problems. *   Discharged Condition: good  Hospital Course: I performed a redo right L5-S1 discectomy on the patient on 05/29/2013. The surgery went well.  The patient's postoperative course was unremarkable. On postop day #1 she requested discharge to home. She was given oral and written discharge instructions. All her questions were answered.  Consults: None Significant Diagnostic Studies: None Treatments: Right L5-S1 redo discectomy using microdissection Discharge Exam: Blood pressure 125/75, pulse 65, temperature 98.6 F (37 C), temperature source Oral, resp. rate 18, SpO2 100.00%. The patient is alert and pleasant. She looks well. She is in no apparent distress. Her strength is grossly normal in her lower extremities.  Disposition: Home  Discharge Orders   Future Orders Complete By Expires   Call MD for:  difficulty breathing, headache or visual disturbances  As directed    Call MD for:  extreme fatigue  As directed    Call MD for:  hives  As directed    Call MD for:  persistant dizziness or light-headedness  As directed    Call MD for:  persistant nausea and vomiting  As directed    Call MD for:  redness, tenderness, or signs of infection (pain, swelling, redness, odor or green/yellow discharge around incision site)  As directed    Call MD for:  severe uncontrolled pain  As directed    Call MD for:  temperature >100.4  As directed    Diet - low sodium heart healthy  As directed    Discharge instructions  As directed    Comments:     Call [phone] for a followup appointment. Take a stool softener while you are using pain medications.   Driving  Restrictions  As directed    Comments:     Do not drive for 2 weeks.   Increase activity slowly  As directed    Lifting restrictions  As directed    Comments:     Do not lift more than 5 pounds. No excessive bending or twisting.   May shower / Bathe  As directed    Comments:     He may shower after the pain she is removed 3 days after surgery. Leave the incision alone.   Remove dressing in 48 hours  As directed    Comments:     Your stitches are under the scan and will dissolve by themselves. The Steri-Strips will fall off after you take a few showers. Do not rub back or pick at the wound, Leave the wound alone.       Medication List    STOP taking these medications       oxyCODONE-acetaminophen 5-325 MG per tablet  Commonly known as:  ROXICET  Replaced by:  oxyCODONE-acetaminophen 10-325 MG per tablet      TAKE these medications       amLODipine 2.5 MG tablet  Commonly known as:  NORVASC  Take 2.5 mg by mouth daily after breakfast.     calcitonin (salmon) 200 UNIT/ACT nasal spray  Commonly known as:  MIACALCIN/FORTICAL  Place 1 spray into the nose daily.     Cholecalciferol 3000 UNITS Tabs  Take 3,000 Units by mouth daily.     diazepam 5 MG tablet  Commonly known as:  VALIUM  Take 1 tablet (5 mg total) by mouth every 6 (six) hours as needed for anxiety (muscle spasms).     diazepam 5 MG tablet  Commonly known as:  VALIUM  Take 1 tablet (5 mg total) by mouth every 6 (six) hours as needed for anxiety (muscle spasms).     DSS 100 MG Caps  Take 100 mg by mouth 2 (two) times daily.     furosemide 20 MG tablet  Commonly known as:  LASIX  Take 20 mg by mouth daily after breakfast.     gabapentin 300 MG capsule  Commonly known as:  NEURONTIN  Take 300 mg by mouth 3 (three) times daily.     ibuprofen 200 MG tablet  Commonly known as:  ADVIL,MOTRIN  Take 400 mg by mouth every 6 (six) hours as needed for pain.     KRILL OIL PO  Take 1 capsule by mouth daily. Hold  while in hospital     LEVOXYL 50 MCG tablet  Generic drug:  levothyroxine  Take 50 mcg by mouth daily before breakfast.     oxyCODONE-acetaminophen 10-325 MG per tablet  Commonly known as:  PERCOCET  Take 1 tablet by mouth every 4 (four) hours as needed for pain.     potassium chloride 10 MEQ CR capsule  Commonly known as:  MICRO-K  Take 10 mEq by mouth daily.     pramipexole 1.5 MG tablet  Commonly known as:  MIRAPEX  Take 1.5 mg by mouth at bedtime.         SignedCristi Loron 05/30/2013, 7:47 AM

## 2013-05-30 NOTE — Progress Notes (Signed)
UR COMPLETED  

## 2013-05-30 NOTE — Progress Notes (Signed)
Pt and husband given D/C instructions with Rx's, verbal understanding given. Pt D/C'd home via wheelchair @ 1250 per MD order. Rema Fendt, RN

## 2019-10-25 ENCOUNTER — Emergency Department (HOSPITAL_COMMUNITY): Payer: Medicare HMO

## 2019-10-25 ENCOUNTER — Other Ambulatory Visit: Payer: Self-pay

## 2019-10-25 ENCOUNTER — Encounter (HOSPITAL_COMMUNITY): Payer: Self-pay | Admitting: Emergency Medicine

## 2019-10-25 ENCOUNTER — Inpatient Hospital Stay (HOSPITAL_COMMUNITY)
Admission: EM | Admit: 2019-10-25 | Discharge: 2019-10-28 | DRG: 494 | Disposition: A | Discharge status: Home Health | Payer: Medicare HMO | Attending: Internal Medicine | Admitting: Internal Medicine

## 2019-10-25 DIAGNOSIS — Z85828 Personal history of other malignant neoplasm of skin: Secondary | ICD-10-CM | POA: Diagnosis not present

## 2019-10-25 DIAGNOSIS — Z85820 Personal history of malignant melanoma of skin: Secondary | ICD-10-CM

## 2019-10-25 DIAGNOSIS — Z8249 Family history of ischemic heart disease and other diseases of the circulatory system: Secondary | ICD-10-CM | POA: Diagnosis not present

## 2019-10-25 DIAGNOSIS — Z96652 Presence of left artificial knee joint: Secondary | ICD-10-CM | POA: Diagnosis present

## 2019-10-25 DIAGNOSIS — I10 Essential (primary) hypertension: Secondary | ICD-10-CM | POA: Diagnosis present

## 2019-10-25 DIAGNOSIS — E876 Hypokalemia: Secondary | ICD-10-CM | POA: Diagnosis present

## 2019-10-25 DIAGNOSIS — S82401A Unspecified fracture of shaft of right fibula, initial encounter for closed fracture: Secondary | ICD-10-CM | POA: Insufficient documentation

## 2019-10-25 DIAGNOSIS — Z881 Allergy status to other antibiotic agents status: Secondary | ICD-10-CM

## 2019-10-25 DIAGNOSIS — S82433A Displaced oblique fracture of shaft of unspecified fibula, initial encounter for closed fracture: Secondary | ICD-10-CM | POA: Diagnosis present

## 2019-10-25 DIAGNOSIS — S82201D Unspecified fracture of shaft of right tibia, subsequent encounter for closed fracture with routine healing: Secondary | ICD-10-CM | POA: Diagnosis not present

## 2019-10-25 DIAGNOSIS — S82309A Unspecified fracture of lower end of unspecified tibia, initial encounter for closed fracture: Secondary | ICD-10-CM

## 2019-10-25 DIAGNOSIS — S82201A Unspecified fracture of shaft of right tibia, initial encounter for closed fracture: Secondary | ICD-10-CM

## 2019-10-25 DIAGNOSIS — Z419 Encounter for procedure for purposes other than remedying health state, unspecified: Secondary | ICD-10-CM

## 2019-10-25 DIAGNOSIS — S82301D Unspecified fracture of lower end of right tibia, subsequent encounter for closed fracture with routine healing: Secondary | ICD-10-CM | POA: Diagnosis not present

## 2019-10-25 DIAGNOSIS — W010XXA Fall on same level from slipping, tripping and stumbling without subsequent striking against object, initial encounter: Secondary | ICD-10-CM | POA: Diagnosis present

## 2019-10-25 DIAGNOSIS — Z20822 Contact with and (suspected) exposure to covid-19: Secondary | ICD-10-CM | POA: Diagnosis present

## 2019-10-25 DIAGNOSIS — W19XXXA Unspecified fall, initial encounter: Secondary | ICD-10-CM

## 2019-10-25 DIAGNOSIS — M810 Age-related osteoporosis without current pathological fracture: Secondary | ICD-10-CM | POA: Diagnosis present

## 2019-10-25 DIAGNOSIS — Z79890 Hormone replacement therapy (postmenopausal): Secondary | ICD-10-CM | POA: Diagnosis not present

## 2019-10-25 DIAGNOSIS — G2581 Restless legs syndrome: Secondary | ICD-10-CM | POA: Diagnosis present

## 2019-10-25 DIAGNOSIS — S82839A Other fracture of upper and lower end of unspecified fibula, initial encounter for closed fracture: Secondary | ICD-10-CM

## 2019-10-25 DIAGNOSIS — S82301A Unspecified fracture of lower end of right tibia, initial encounter for closed fracture: Secondary | ICD-10-CM | POA: Diagnosis present

## 2019-10-25 DIAGNOSIS — S82401D Unspecified fracture of shaft of right fibula, subsequent encounter for closed fracture with routine healing: Secondary | ICD-10-CM | POA: Diagnosis not present

## 2019-10-25 DIAGNOSIS — S82831D Other fracture of upper and lower end of right fibula, subsequent encounter for closed fracture with routine healing: Secondary | ICD-10-CM | POA: Diagnosis not present

## 2019-10-25 DIAGNOSIS — Z79899 Other long term (current) drug therapy: Secondary | ICD-10-CM | POA: Diagnosis not present

## 2019-10-25 DIAGNOSIS — E039 Hypothyroidism, unspecified: Secondary | ICD-10-CM | POA: Diagnosis present

## 2019-10-25 HISTORY — DX: Unspecified fracture of shaft of right tibia, initial encounter for closed fracture: S82.201A

## 2019-10-25 LAB — CBC WITH DIFFERENTIAL/PLATELET
Abs Immature Granulocytes: 0.03 10*3/uL (ref 0.00–0.07)
Basophils Absolute: 0.1 10*3/uL (ref 0.0–0.1)
Basophils Relative: 1 %
Eosinophils Absolute: 0.1 10*3/uL (ref 0.0–0.5)
Eosinophils Relative: 1 %
HCT: 38.4 % (ref 36.0–46.0)
Hemoglobin: 12.7 g/dL (ref 12.0–15.0)
Immature Granulocytes: 0 %
Lymphocytes Relative: 8 %
Lymphs Abs: 0.6 10*3/uL — ABNORMAL LOW (ref 0.7–4.0)
MCH: 30.1 pg (ref 26.0–34.0)
MCHC: 33.1 g/dL (ref 30.0–36.0)
MCV: 91 fL (ref 80.0–100.0)
Monocytes Absolute: 0.3 10*3/uL (ref 0.1–1.0)
Monocytes Relative: 4 %
Neutro Abs: 6.9 10*3/uL (ref 1.7–7.7)
Neutrophils Relative %: 86 %
Platelets: 252 10*3/uL (ref 150–400)
RBC: 4.22 MIL/uL (ref 3.87–5.11)
RDW: 13.4 % (ref 11.5–15.5)
WBC: 8 10*3/uL (ref 4.0–10.5)
nRBC: 0 % (ref 0.0–0.2)

## 2019-10-25 LAB — RESPIRATORY PANEL BY RT PCR (FLU A&B, COVID)
Influenza A by PCR: NEGATIVE
Influenza B by PCR: NEGATIVE
SARS Coronavirus 2 by RT PCR: NEGATIVE

## 2019-10-25 LAB — BASIC METABOLIC PANEL
Anion gap: 9 (ref 5–15)
BUN: 15 mg/dL (ref 8–23)
CO2: 22 mmol/L (ref 22–32)
Calcium: 9.5 mg/dL (ref 8.9–10.3)
Chloride: 110 mmol/L (ref 98–111)
Creatinine, Ser: 0.68 mg/dL (ref 0.44–1.00)
GFR calc Af Amer: >60 mL/min (ref >60)
GFR calc non Af Amer: >60 mL/min (ref >60)
Glucose, Bld: 104 mg/dL — ABNORMAL HIGH (ref 70–99)
Potassium: 3.3 mmol/L — ABNORMAL LOW (ref 3.5–5.1)
Sodium: 141 mmol/L (ref 135–145)

## 2019-10-25 MED ORDER — LORAZEPAM 2 MG/ML IJ SOLN
0.5000 mg | Freq: Once | INTRAMUSCULAR | Status: AC
  Administered 2019-10-25: 21:00:00 0.5 mg via INTRAVENOUS
  Filled 2019-10-25: qty 1

## 2019-10-25 MED ORDER — HYDROMORPHONE HCL 1 MG/ML IJ SOLN
0.5000 mg | Freq: Once | INTRAMUSCULAR | Status: AC
  Administered 2019-10-25: 0.5 mg via INTRAVENOUS
  Filled 2019-10-25: qty 1

## 2019-10-25 MED ORDER — OXYCODONE-ACETAMINOPHEN 5-325 MG PO TABS: 1.0000 | ORAL_TABLET | Freq: Once | ORAL | Status: DC

## 2019-10-25 MED ORDER — ACETAMINOPHEN 650 MG RE SUPP: 650.0000 mg | Freq: Four times a day (QID) | RECTAL | Status: DC | PRN

## 2019-10-25 MED ORDER — IBUPROFEN 200 MG PO TABS: 400.0000 mg | ORAL_TABLET | Freq: Once | ORAL | Status: DC

## 2019-10-25 MED ORDER — HYDROCODONE-ACETAMINOPHEN 5-325 MG PO TABS: 1.0000 | ORAL_TABLET | ORAL | Status: DC | PRN

## 2019-10-25 MED ORDER — ACETAMINOPHEN 325 MG PO TABS: 650.0000 mg | ORAL_TABLET | Freq: Four times a day (QID) | ORAL | Status: DC | PRN

## 2019-10-25 MED ORDER — ONDANSETRON HCL 4 MG PO TABS: 4.0000 mg | ORAL_TABLET | Freq: Four times a day (QID) | ORAL | Status: DC | PRN

## 2019-10-25 MED ORDER — HYDROMORPHONE HCL 1 MG/ML IJ SOLN
0.5000 mg | INTRAMUSCULAR | Status: DC | PRN
  Administered 2019-10-26 (×3): 0.5 mg via INTRAVENOUS
  Filled 2019-10-25: qty 0.5
  Filled 2019-10-25 (×2): qty 1

## 2019-10-25 MED ORDER — ONDANSETRON HCL 4 MG/2ML IJ SOLN: 4.0000 mg | Freq: Four times a day (QID) | INTRAMUSCULAR | Status: DC | PRN

## 2019-10-25 NOTE — ED Provider Notes (Signed)
[facility] DEPT Provider Note   CSN: [id number] Arrival date & time: 10/25/19  1825     History Chief Complaint  Patient presents with  . Ankle Pain    Donna Alvarez is a 74 y.o. female.  HPI   74 year old female with right ankle/leg pain.  Patient slipped coming out of the vehicle.  Severe persistent pain since then.  Denies any other injuries.  No numbness or tingling.  She is not on blood thinners.  Past Medical History:  Diagnosis Date  . Arthritis    hands, +low back   . 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 Chronic kidney disease    kidney stones - 2013- treated /w surg. procedure   . Complication of anesthesia    with anesth. 10/2010- very emotional & slow to wake up, 12/2010- no problems  . Dysrhythmia   . Hernia   . Hypertension   . Hypothyroidism   . Neuromuscular disorder ([place])    uses Mirapex for RLS  . Thyroid disease     There are no problems to display for this patient.   Past Surgical History:  Procedure Laterality Date  . childbirth     x2   . CYSTOSCOPY     for stone removal   . HERNIA REPAIR     12/2010- both inguinal & umbilical done at same time  . KNEE ARTHROSCOPY  10/2010   L  . LUMBAR LAMINECTOMY/DECOMPRESSION MICRODISCECTOMY Right 12/25/2012   Procedure: LUMBAR LAMINECTOMY/DECOMPRESSION MICRODISCECTOMY 1 LEVEL;  Surgeon: Ophelia Charter, MD;  Location: [facility];  Service: Neurosurgery;  Laterality: Right;  RIGHT Lumbar five sacral one diskectomy  . LUMBAR LAMINECTOMY/DECOMPRESSION MICRODISCECTOMY Right 05/29/2013   Procedure: LUMBAR LAMINECTOMY/DECOMPRESSION MICRODISCECTOMY 1 LEVEL,RIGHT LUMBAR FIVE-SACRAL ONE;  Surgeon: Ophelia Charter, MD;  Location: [facility];  Service: Neurosurgery;  Laterality: Right;  RIGHT  . MELANOMA EXCISION     posterior area of R leg.  Marland Kitchen TORN CARTILAGE  2012     OB History   No obstetric history on file.     Family History   Problem Relation Age of Onset  . Cancer Father   . Hypertension Father     Social History   Tobacco Use  . Smoking status: Never Smoker  Substance Use Topics  . Alcohol use: No  . Drug use: No    Home Medications Prior to Admission medications   Medication Sig Start Date End Date Taking? Authorizing Provider  amLODipine (NORVASC) 2.5 MG tablet Take 2.5 mg by mouth daily after breakfast.     [provider]  calcitonin, salmon, (MIACALCIN/FORTICAL) 200 UNIT/ACT nasal spray Place 1 spray into the nose daily.    [provider]  Cholecalciferol 3000 UNITS TABS Take 3,000 Units by mouth daily.    [provider]  diazepam (VALIUM) 5 MG tablet Take 1 tablet (5 mg total) by mouth every 6 (six) hours as needed for anxiety (muscle spasms). 12/25/12   Ashok Pall, MD  diazepam (VALIUM) 5 MG tablet Take 1 tablet (5 mg total) by mouth every 6 (six) hours as needed for anxiety (muscle spasms). 05/30/13   Newman Pies, MD  docusate sodium 100 MG CAPS Take 100 mg by mouth 2 (two) times daily. 05/30/13   Newman Pies, MD  furosemide (LASIX) 20 MG tablet Take 20 mg by mouth daily after breakfast.     [provider]  gabapentin (NEURONTIN) 300  MG capsule Take 300 mg by mouth 3 (three) times daily.     [provider]  ibuprofen (ADVIL,MOTRIN) 200 MG tablet Take 400 mg by mouth every 6 (six) hours as needed for pain.    [provider]  KRILL OIL PO Take 1 capsule by mouth daily. Hold while in hospital    [provider]  levothyroxine (LEVOXYL) 50 MCG tablet Take 50 mcg by mouth daily before breakfast.     [provider]  oxyCODONE-acetaminophen (PERCOCET) 10-325 MG per tablet Take 1 tablet by mouth every 4 (four) hours as needed for pain. 05/30/13   Newman Pies, MD  potassium chloride (MICRO-K) 10 MEQ CR capsule Take 10 mEq by mouth daily.    [provider]  pramipexole (MIRAPEX) 1.5 MG tablet Take 1.5 mg  by mouth at bedtime.     [provider]    Allergies    Patient has no known allergies.  Review of Systems   Review of Systems All systems reviewed and negative, other than as noted in HPI.  Physical Exam Updated Vital Signs BP (!) 189/92 (BP Location: Right Arm)   Pulse 76   Temp 98.3 F (36.8 C) (Oral)   Resp 14   Ht 5' (1.524 m)   Wt 47.6 kg   SpO2 100%   BMI 20.51 kg/m   Physical Exam Vitals and nursing note reviewed.  Constitutional:      General: She is not in acute distress.    Appearance: She is well-developed.  HENT:     Head: Normocephalic and atraumatic.  Eyes:     General:        Right eye: No discharge.        Left eye: No discharge.     Conjunctiva/sclera: Conjunctivae normal.  Cardiovascular:     Rate and Rhythm: Normal rate and regular rhythm.     Heart sounds: Normal heart sounds. No murmur. No friction rub. No gallop.   Pulmonary:     Effort: Pulmonary effort is normal. No respiratory distress.     Breath sounds: Normal breath sounds.  Abdominal:     General: There is no distension.     Palpations: Abdomen is soft.     Tenderness: There is no abdominal tenderness.  Musculoskeletal:        General: Tenderness present.     Cervical back: Neck supple.     Comments: Tenderness to palpation and mild swelling of the distal third of the right tib/fib.  Closed injury.  Palpable DP pulse.  Sensation intact to light touch.  Able to plantar and dorsiflex foot although range of motion limited by pain.  Skin:    General: Skin is warm and dry.  Neurological:     Mental Status: She is alert.  Psychiatric:        Behavior: Behavior normal.        Thought Content: Thought content normal.     ED Results / Procedures / Treatments   Labs (all labs ordered are listed, but only abnormal results are displayed) Labs Reviewed - No data to display  EKG None  Radiology DG Ankle Complete Right  Result Date: 10/25/2019 CLINICAL DATA:  74 year old  female with fall and right lower extremity pain. EXAM: RIGHT ANKLE - COMPLETE 3+ VIEW COMPARISON:  None. FINDINGS: There is a mildly displaced spiral fracture of the distal tibial diaphysis with approximately 5 mm lateral displacement of the distal fracture fragment. Minimally displaced oblique fracture of the  distal fibular diaphysis. No dislocation. The bones are osteopenic. Mild diffuse subcutaneous edema. IMPRESSION: 1. Fractures of the distal tibia and fibula. 2. Osteopenia. Electronically Signed   By: Anner Crete M.D.   On: 10/25/2019 19:31    Procedures Procedures (including critical care time)  Medications Ordered in ED Medications  oxyCODONE-acetaminophen (PERCOCET/ROXICET) 5-325 MG per tablet 1 tablet (has no administration in time range)  ibuprofen (ADVIL) tablet 400 mg (has no administration in time range)    ED Course  I have reviewed the triage vital signs and the nursing notes.  Pertinent labs & imaging results that were available during my care of the patient were reviewed by me and considered in my medical decision making (see chart for details).    MDM Rules/Calculators/A&P                      74 year old female with distal right tibia/fibula fracture.  Closed. Neurovascularly intact.  Discussed with Dr. Lorin Mercy, orthopedic surgery.  N.p.o. after midnight.  He will see in the morning and plans for ORIF.  Basic preop labs, chest x-ray, EKG and Covid testing.  Pain medication. Splint.  Final Clinical Impression(s) / ED Diagnoses Final diagnoses:  Closed fracture of right tibia and fibula, initial encounter    Rx / DC Orders ED Discharge Orders    None       Virgel Manifold, MD 10/25/19 2030

## 2019-10-25 NOTE — ED Notes (Signed)
Pt. Husband, [name] took pt. Belongings home(1 black purse, 1 pr. Black shoes, 1 burgundy jacket, 1 white bra, and 1 mint green sweatshirt).

## 2019-10-25 NOTE — Progress Notes (Signed)
Plan tibial nail tomorrow Sunday at about 12 noon for stabilzation of  Right Tib/fib fracture.  Will see pt in AM to discuss procedure.

## 2019-10-25 NOTE — ED Triage Notes (Addendum)
Per pt, fell getting out of the car injuring right ankle

## 2019-10-26 ENCOUNTER — Other Ambulatory Visit: Payer: Self-pay

## 2019-10-26 ENCOUNTER — Inpatient Hospital Stay (HOSPITAL_COMMUNITY): Payer: Medicare HMO

## 2019-10-26 ENCOUNTER — Encounter (HOSPITAL_COMMUNITY)
Admission: EM | Disposition: A | Discharge status: Home Health | Payer: Self-pay | Source: Home / Self Care | Attending: Internal Medicine

## 2019-10-26 ENCOUNTER — Inpatient Hospital Stay (HOSPITAL_COMMUNITY): Payer: Medicare HMO | Admitting: Certified Registered Nurse Anesthetist

## 2019-10-26 DIAGNOSIS — S82301D Unspecified fracture of lower end of right tibia, subsequent encounter for closed fracture with routine healing: Secondary | ICD-10-CM

## 2019-10-26 DIAGNOSIS — I10 Essential (primary) hypertension: Secondary | ICD-10-CM | POA: Diagnosis present

## 2019-10-26 DIAGNOSIS — S82201A Unspecified fracture of shaft of right tibia, initial encounter for closed fracture: Principal | ICD-10-CM

## 2019-10-26 DIAGNOSIS — S82831D Other fracture of upper and lower end of right fibula, subsequent encounter for closed fracture with routine healing: Secondary | ICD-10-CM

## 2019-10-26 DIAGNOSIS — S82401A Unspecified fracture of shaft of right fibula, initial encounter for closed fracture: Secondary | ICD-10-CM

## 2019-10-26 HISTORY — PX: TIBIA IM NAIL INSERTION: SHX2516

## 2019-10-26 LAB — COMPREHENSIVE METABOLIC PANEL
ALT: 12 U/L (ref 0–44)
AST: 13 U/L — ABNORMAL LOW (ref 15–41)
Albumin: 3.8 g/dL (ref 3.5–5.0)
Alkaline Phosphatase: 52 U/L (ref 38–126)
Anion gap: 8 (ref 5–15)
BUN: 13 mg/dL (ref 8–23)
CO2: 24 mmol/L (ref 22–32)
Calcium: 9.2 mg/dL (ref 8.9–10.3)
Chloride: 108 mmol/L (ref 98–111)
Creatinine, Ser: 0.59 mg/dL (ref 0.44–1.00)
GFR calc Af Amer: >60 mL/min (ref >60)
GFR calc non Af Amer: >60 mL/min (ref >60)
Glucose, Bld: 97 mg/dL (ref 70–99)
Potassium: 3.5 mmol/L (ref 3.5–5.1)
Sodium: 140 mmol/L (ref 135–145)
Total Bilirubin: 1 mg/dL (ref 0.3–1.2)
Total Protein: 5.8 g/dL — ABNORMAL LOW (ref 6.5–8.1)

## 2019-10-26 LAB — CBC
HCT: 35.8 % — ABNORMAL LOW (ref 36.0–46.0)
Hemoglobin: 11.6 g/dL — ABNORMAL LOW (ref 12.0–15.0)
MCH: 30.1 pg (ref 26.0–34.0)
MCHC: 32.4 g/dL (ref 30.0–36.0)
MCV: 92.7 fL (ref 80.0–100.0)
Platelets: 226 10*3/uL (ref 150–400)
RBC: 3.86 MIL/uL — ABNORMAL LOW (ref 3.87–5.11)
RDW: 13.4 % (ref 11.5–15.5)
WBC: 5.5 10*3/uL (ref 4.0–10.5)
nRBC: 0 % (ref 0.0–0.2)

## 2019-10-26 LAB — PROTIME-INR
INR: 0.9 (ref 0.8–1.2)
Prothrombin Time: 12.1 seconds (ref 11.4–15.2)

## 2019-10-26 LAB — MRSA PCR SCREENING: MRSA by PCR: NEGATIVE

## 2019-10-26 SURGERY — INSERTION, INTRAMEDULLARY ROD, TIBIA
Anesthesia: General | Site: Leg Lower | Laterality: Right

## 2019-10-26 MED ORDER — CLINDAMYCIN PHOSPHATE 900 MG/50ML IV SOLN
INTRAVENOUS | Status: AC
  Filled 2019-10-26: qty 50

## 2019-10-26 MED ORDER — BUPIVACAINE HCL (PF) 0.25 % IJ SOLN
INTRAMUSCULAR | Status: AC
  Filled 2019-10-26: qty 30

## 2019-10-26 MED ORDER — HYDROMORPHONE HCL 1 MG/ML IJ SOLN
0.2500 mg | INTRAMUSCULAR | Status: DC | PRN
  Administered 2019-10-26 (×2): 0.5 mg via INTRAVENOUS

## 2019-10-26 MED ORDER — LACTATED RINGERS IV SOLN: INTRAVENOUS | Status: DC | PRN

## 2019-10-26 MED ORDER — ONDANSETRON HCL 4 MG PO TABS: 4.0000 mg | ORAL_TABLET | Freq: Four times a day (QID) | ORAL | Status: DC | PRN

## 2019-10-26 MED ORDER — METOCLOPRAMIDE HCL 5 MG PO TABS: 5.0000 mg | ORAL_TABLET | Freq: Three times a day (TID) | ORAL | Status: DC | PRN

## 2019-10-26 MED ORDER — METOCLOPRAMIDE HCL 5 MG/ML IJ SOLN: 5.0000 mg | Freq: Three times a day (TID) | INTRAMUSCULAR | Status: DC | PRN

## 2019-10-26 MED ORDER — DOCUSATE SODIUM 100 MG PO CAPS
100.0000 mg | ORAL_CAPSULE | Freq: Two times a day (BID) | ORAL | Status: DC
  Administered 2019-10-27 – 2019-10-28 (×4): 100 mg via ORAL
  Filled 2019-10-26 (×4): qty 1

## 2019-10-26 MED ORDER — PRAMIPEXOLE DIHYDROCHLORIDE 0.25 MG PO TABS
1.5000 mg | ORAL_TABLET | Freq: Every day | ORAL | Status: DC
  Administered 2019-10-27 (×2): 1.5 mg via ORAL
  Filled 2019-10-26 (×2): qty 6

## 2019-10-26 MED ORDER — ONDANSETRON HCL 4 MG/2ML IJ SOLN
INTRAMUSCULAR | Status: DC | PRN
  Administered 2019-10-26: 4 mg via INTRAVENOUS

## 2019-10-26 MED ORDER — CLINDAMYCIN PHOSPHATE 900 MG/50ML IV SOLN
900.0000 mg | INTRAVENOUS | Status: AC
  Administered 2019-10-26: 900 mg via INTRAVENOUS

## 2019-10-26 MED ORDER — CHLORHEXIDINE GLUCONATE 4 % EX LIQD
60.0000 mL | Freq: Once | CUTANEOUS | Status: AC
  Administered 2019-10-26: 4 via TOPICAL

## 2019-10-26 MED ORDER — ONDANSETRON HCL 4 MG/2ML IJ SOLN: 4.0000 mg | Freq: Four times a day (QID) | INTRAMUSCULAR | Status: DC | PRN

## 2019-10-26 MED ORDER — VITAMIN D3 25 MCG (1000 UNIT) PO TABS
3000.0000 [IU] | ORAL_TABLET | Freq: Every day | ORAL | Status: DC
  Administered 2019-10-27 – 2019-10-28 (×2): 3000 [IU] via ORAL
  Filled 2019-10-26 (×2): qty 3

## 2019-10-26 MED ORDER — ENSURE PRE-SURGERY PO LIQD
296.0000 mL | Freq: Once | ORAL | Status: DC
  Filled 2019-10-26: qty 296

## 2019-10-26 MED ORDER — DEXAMETHASONE SODIUM PHOSPHATE 10 MG/ML IJ SOLN
INTRAMUSCULAR | Status: DC | PRN
  Administered 2019-10-26: 10 mg via INTRAVENOUS

## 2019-10-26 MED ORDER — PROMETHAZINE HCL 25 MG/ML IJ SOLN: 6.2500 mg | INTRAMUSCULAR | Status: DC | PRN

## 2019-10-26 MED ORDER — SODIUM CHLORIDE 0.45 % IV SOLN: INTRAVENOUS | Status: DC

## 2019-10-26 MED ORDER — FENTANYL CITRATE (PF) 100 MCG/2ML IJ SOLN
INTRAMUSCULAR | Status: AC
  Filled 2019-10-26: qty 2

## 2019-10-26 MED ORDER — POTASSIUM CHLORIDE CRYS ER 10 MEQ PO TBCR
10.0000 meq | EXTENDED_RELEASE_TABLET | Freq: Every day | ORAL | Status: DC
  Administered 2019-10-27 – 2019-10-28 (×2): 10 meq via ORAL
  Filled 2019-10-26 (×2): qty 1

## 2019-10-26 MED ORDER — SODIUM CHLORIDE 0.9 % IV SOLN: INTRAVENOUS | Status: DC

## 2019-10-26 MED ORDER — MEPERIDINE HCL 50 MG/ML IJ SOLN: 6.2500 mg | INTRAMUSCULAR | Status: DC | PRN

## 2019-10-26 MED ORDER — 0.9 % SODIUM CHLORIDE (POUR BTL) OPTIME
TOPICAL | Status: DC | PRN
  Administered 2019-10-26: 1000 mL

## 2019-10-26 MED ORDER — PROPOFOL 10 MG/ML IV BOLUS
INTRAVENOUS | Status: AC
  Filled 2019-10-26: qty 20

## 2019-10-26 MED ORDER — POVIDONE-IODINE 10 % EX SWAB: 2.0000 "application " | Freq: Once | CUTANEOUS | Status: DC

## 2019-10-26 MED ORDER — ACETAMINOPHEN 10 MG/ML IV SOLN
INTRAVENOUS | Status: AC
  Filled 2019-10-26: qty 100

## 2019-10-26 MED ORDER — ENOXAPARIN SODIUM 40 MG/0.4ML ~~LOC~~ SOLN
40.0000 mg | SUBCUTANEOUS | Status: DC
  Administered 2019-10-27 – 2019-10-28 (×2): 40 mg via SUBCUTANEOUS
  Filled 2019-10-26 (×2): qty 0.4

## 2019-10-26 MED ORDER — POLYETHYLENE GLYCOL 3350 17 G PO PACK
17.0000 g | PACK | Freq: Every day | ORAL | Status: DC | PRN
  Administered 2019-10-27: 17 g via ORAL
  Filled 2019-10-26: qty 1

## 2019-10-26 MED ORDER — FUROSEMIDE 20 MG PO TABS
20.0000 mg | ORAL_TABLET | Freq: Every day | ORAL | Status: DC
  Administered 2019-10-27: 20 mg via ORAL
  Filled 2019-10-26: qty 1

## 2019-10-26 MED ORDER — HYDROMORPHONE HCL 1 MG/ML IJ SOLN
0.5000 mg | INTRAMUSCULAR | Status: DC | PRN
  Administered 2019-10-26: 0.5 mg via INTRAVENOUS
  Filled 2019-10-26: qty 0.5

## 2019-10-26 MED ORDER — PROPOFOL 500 MG/50ML IV EMUL
INTRAVENOUS | Status: DC | PRN
  Administered 2019-10-26: 50 mg via INTRAVENOUS
  Administered 2019-10-26: 100 mg via INTRAVENOUS
  Administered 2019-10-26: 50 mg via INTRAVENOUS

## 2019-10-26 MED ORDER — ONDANSETRON HCL 4 MG/2ML IJ SOLN
INTRAMUSCULAR | Status: AC
  Filled 2019-10-26: qty 2

## 2019-10-26 MED ORDER — STERILE WATER FOR IRRIGATION IR SOLN
Status: DC | PRN
  Administered 2019-10-26: 1000 mL

## 2019-10-26 MED ORDER — DEXAMETHASONE SODIUM PHOSPHATE 10 MG/ML IJ SOLN
INTRAMUSCULAR | Status: AC
  Filled 2019-10-26: qty 1

## 2019-10-26 MED ORDER — AMLODIPINE BESYLATE 5 MG PO TABS
2.5000 mg | ORAL_TABLET | Freq: Every day | ORAL | Status: DC
  Administered 2019-10-27 – 2019-10-28 (×2): 2.5 mg via ORAL
  Filled 2019-10-26 (×2): qty 1

## 2019-10-26 MED ORDER — LIDOCAINE 2% (20 MG/ML) 5 ML SYRINGE
INTRAMUSCULAR | Status: AC
  Filled 2019-10-26: qty 5

## 2019-10-26 MED ORDER — HYDROMORPHONE HCL 1 MG/ML IJ SOLN
INTRAMUSCULAR | Status: AC
  Filled 2019-10-26: qty 1

## 2019-10-26 MED ORDER — LEVOTHYROXINE SODIUM 88 MCG PO TABS
88.0000 ug | ORAL_TABLET | Freq: Every day | ORAL | Status: DC
  Administered 2019-10-27 – 2019-10-28 (×2): 88 ug via ORAL
  Filled 2019-10-26 (×2): qty 1

## 2019-10-26 MED ORDER — FENTANYL CITRATE (PF) 100 MCG/2ML IJ SOLN
INTRAMUSCULAR | Status: DC | PRN
  Administered 2019-10-26 (×3): 50 ug via INTRAVENOUS

## 2019-10-26 MED ORDER — BUPIVACAINE HCL (PF) 0.25 % IJ SOLN
INTRAMUSCULAR | Status: DC | PRN
  Administered 2019-10-26: 18 mL

## 2019-10-26 MED ORDER — ACETAMINOPHEN 10 MG/ML IV SOLN
1000.0000 mg | Freq: Once | INTRAVENOUS | Status: DC | PRN
  Administered 2019-10-26: 14:00:00 1000 mg via INTRAVENOUS

## 2019-10-26 MED ORDER — HYDROCODONE-ACETAMINOPHEN 7.5-325 MG PO TABS
1.0000 | ORAL_TABLET | Freq: Four times a day (QID) | ORAL | Status: DC | PRN
  Administered 2019-10-26: 17:00:00 1 via ORAL
  Administered 2019-10-27 (×3): 2 via ORAL
  Administered 2019-10-28 (×2): 1 via ORAL
  Filled 2019-10-26 (×3): qty 2
  Filled 2019-10-26: qty 1
  Filled 2019-10-26 (×2): qty 2

## 2019-10-26 SURGICAL SUPPLY — 49 items
BAG ZIPLOCK 12X15 (MISCELLANEOUS) ×2 IMPLANT
BIT DRILL 3.8X6 NS (BIT) ×1 IMPLANT
BIT DRILL 4.4 NS (BIT) ×1 IMPLANT
BLADE SURG 15 STRL LF DISP TIS (BLADE) ×1 IMPLANT
BLADE SURG 15 STRL SS (BLADE) ×1
BNDG ELASTIC 4X5.8 VLCR STR LF (GAUZE/BANDAGES/DRESSINGS) ×1 IMPLANT
CHLORAPREP W/TINT 26 (MISCELLANEOUS) ×2 IMPLANT
COVER BACK TABLE 60X90IN (DRAPES) ×2 IMPLANT
COVER MAYO STAND STRL (DRAPES) ×1 IMPLANT
COVER SURGICAL LIGHT HANDLE (MISCELLANEOUS) ×2 IMPLANT
DRAPE C-ARM 42X120 X-RAY (DRAPES) ×1 IMPLANT
DRAPE C-ARMOR (DRAPES) ×1 IMPLANT
DRAPE ORTHO SPLIT 77X108 STRL (DRAPES) ×2
DRAPE STERI IOBAN 125X83 (DRAPES) ×2 IMPLANT
DRAPE SURG ORHT 6 SPLT 77X108 (DRAPES) ×2 IMPLANT
DRSG PAD ABDOMINAL 8X10 ST (GAUZE/BANDAGES/DRESSINGS) ×2 IMPLANT
ELECT REM PT RETURN 15FT ADLT (MISCELLANEOUS) ×2 IMPLANT
GAUZE SPONGE 4X4 12PLY STRL (GAUZE/BANDAGES/DRESSINGS) ×4 IMPLANT
GAUZE XEROFORM 5X9 LF (GAUZE/BANDAGES/DRESSINGS) ×1 IMPLANT
GLOVE BIOGEL PI IND STRL 7.0 (GLOVE) IMPLANT
GLOVE BIOGEL PI IND STRL 7.5 (GLOVE) IMPLANT
GLOVE BIOGEL PI IND STRL 8.5 (GLOVE) IMPLANT
GLOVE BIOGEL PI INDICATOR 7.0 (GLOVE) ×2
GLOVE BIOGEL PI INDICATOR 7.5 (GLOVE) ×1
GLOVE BIOGEL PI INDICATOR 8.5 (GLOVE) ×1
GLOVE INDICATOR 7.5 STRL GRN (GLOVE) ×2 IMPLANT
GLOVE ORTHO TXT STRL SZ7.5 (GLOVE) ×2 IMPLANT
GOWN STRL REUS W/TWL LRG LVL3 (GOWN DISPOSABLE) ×3 IMPLANT
GUIDEPIN 3.2X17.5 THRD DISP (PIN) ×1 IMPLANT
GUIDEWIRE BALL NOSE 80CM (WIRE) ×1 IMPLANT
KIT BASIN OR (CUSTOM PROCEDURE TRAY) ×2 IMPLANT
KIT TURNOVER KIT A (KITS) ×1 IMPLANT
NAIL TIBIAL 11X30CM (Nail) ×1 IMPLANT
PACK GENERAL/GYN (CUSTOM PROCEDURE TRAY) ×2 IMPLANT
PAD CAST 4YDX4 CTTN HI CHSV (CAST SUPPLIES) IMPLANT
PADDING CAST COTTON 4X4 STRL (CAST SUPPLIES) ×2
PROTECTOR NERVE ULNAR (MISCELLANEOUS) ×2 IMPLANT
SCREW ACECAP 36MM (Screw) ×1 IMPLANT
SCREW ACECAP 40MM (Screw) ×1 IMPLANT
SCREW ACECAP 44MM (Screw) ×1 IMPLANT
SCREW PROXIMAL DEPUY (Screw) ×2 IMPLANT
SCREW PRXML FT 45X5.5XLCK NS (Screw) IMPLANT
SCREW PRXML FT 50X5.5XLCK NS (Screw) IMPLANT
STAPLER VISISTAT 35W (STAPLE) ×2 IMPLANT
SUT VIC AB 0 CT1 27 (SUTURE) ×2
SUT VIC AB 0 CT1 27XBRD ANTBC (SUTURE) ×2 IMPLANT
SUT VIC AB 2-0 CT1 27 (SUTURE) ×1
SUT VIC AB 2-0 CT1 TAPERPNT 27 (SUTURE) ×1 IMPLANT
TOWEL OR 17X26 10 PK STRL BLUE (TOWEL DISPOSABLE) ×4 IMPLANT

## 2019-10-26 NOTE — Transfer of Care (Signed)
Immediate Anesthesia Transfer of Care Note  Patient: Donna Alvarez  Procedure(s) Performed: Procedure(s): INTRAMEDULLARY (IM) NAIL TIBIAL (Right)  Patient Location: PACU  Anesthesia Type:General  Level of Consciousness: Alert, Awake, Oriented  Airway & Oxygen Therapy: Patient Spontanous Breathing  Post-op Assessment: Report given to RN  Post vital signs: Reviewed and stable  Last Vitals:  Vitals:   10/26/19 0600 10/26/19 0930  BP: (!) 142/69 136/74  Pulse:  84  Resp: (!) 25 18  Temp:  36.8 C  SpO2:  [id number]    Complications: No apparent anesthesia complications

## 2019-10-26 NOTE — Anesthesia Postprocedure Evaluation (Signed)
Anesthesia Post Note  Patient: Donna Alvarez  Procedure(s) Performed: INTRAMEDULLARY (IM) NAIL TIBIAL (Right Leg Lower)     Patient location during evaluation: PACU Anesthesia Type: General Level of consciousness: awake and sedated Pain management: pain level controlled Vital Signs Assessment: post-procedure vital signs reviewed and stable Respiratory status: spontaneous breathing Cardiovascular status: stable Postop Assessment: no apparent nausea or vomiting Anesthetic complications: no    Last Vitals:  Vitals:   10/26/19 1300 10/26/19 1330  BP: 127/84 137/67  Pulse: 75 69  Resp: 10 10  Temp: 36.7 C   SpO2: 100% 100%    Last Pain:  Vitals:   10/26/19 1330  TempSrc:   PainSc: 8    Pain Goal: Patients Stated Pain Goal: 5 (10/26/19 1330)                 Huston Foley

## 2019-10-26 NOTE — H&P (Signed)
History and Physical    Donna Alvarez [id number] DOB: June 14, 1946 DOA: 10/25/2019  PCP: Parke Poisson, MD (Confirm with patient/family/NH records and if not entered, this has to be entered at [facility] point of entry) Patient coming from: Home  I have personally briefly reviewed patient's old medical records in [place]  Chief Complaint: Right lower extremity pain  HPI: Donna Alvarez is a 74 y.o. female with medical history significant of arthritis, hypertension and hypothyroidism presented to ED for evaluation of severe pain in right ankle area.  Patient states that the pain started after she slipped while coming out of the vehicle and she is unable to bear the weight on that side.  Patient states that pain had gotten worse to 10 out of 10 on pain scale, getting worse with movement or putting pressure on the leg and gets better with laying on the bed.  Patient denies any weakness, numbness or tingling sensation in her right leg.  Patient also denies fever, chills, headache, dizziness, chest pain, shortness of breath, nausea, vomiting, abdominal pain and urinary symptoms.  ED Course: On arrival to the ED patient had blood pressure of 189/92, heart rate 76, temperature 98.3, respiratory rate 14 and oxygen saturation 100% on room air.  Blood work showed mild hypokalemia.  X-ray of right lower extremity showed fracture of distal tibia/fibula.  Patient was given Dilaudid in the ED.  ED physician contacted Dr. Ames Coupe who will perform orif tomorrow.   Review of Systems: As per HPI otherwise 10 point review of systems negative.    Past Medical History:  Diagnosis Date  . Arthritis    hands, +low back   . 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 Chronic kidney disease    kidney stones - 2013- treated /w surg. procedure   . Complication of anesthesia    with anesth. 10/2010- very emotional & slow to wake up, 12/2010- no problems  .  Dysrhythmia   . Hernia   . Hypertension   . Hypothyroidism   . Neuromuscular disorder ([place])    uses Mirapex for RLS  . Thyroid disease     Past Surgical History:  Procedure Laterality Date  . childbirth     x2   . CYSTOSCOPY     for stone removal   . HERNIA REPAIR     12/2010- both inguinal & umbilical done at same time  . KNEE ARTHROSCOPY  10/2010   L  . LUMBAR LAMINECTOMY/DECOMPRESSION MICRODISCECTOMY Right 12/25/2012   Procedure: LUMBAR LAMINECTOMY/DECOMPRESSION MICRODISCECTOMY 1 LEVEL;  Surgeon: Ophelia Charter, MD;  Location: [facility];  Service: Neurosurgery;  Laterality: Right;  RIGHT Lumbar five sacral one diskectomy  . LUMBAR LAMINECTOMY/DECOMPRESSION MICRODISCECTOMY Right 05/29/2013   Procedure: LUMBAR LAMINECTOMY/DECOMPRESSION MICRODISCECTOMY 1 LEVEL,RIGHT LUMBAR FIVE-SACRAL ONE;  Surgeon: Ophelia Charter, MD;  Location: [facility];  Service: Neurosurgery;  Laterality: Right;  RIGHT  . MELANOMA EXCISION     posterior area of R leg.  Marland Kitchen TORN CARTILAGE  2012     reports that she has never smoked. She does not have any smokeless tobacco history on file. She reports that she does not drink alcohol or use drugs.  No Known Allergies  Family History  Problem Relation Age of Onset  . Cancer Father   . Hypertension Father    Prior to Admission medications   Medication Sig Start Date End Date Taking? Authorizing Provider  amLODipine (NORVASC) 2.5  MG tablet Take 2.5 mg by mouth daily after breakfast.     [provider]  calcitonin, salmon, (MIACALCIN/FORTICAL) 200 UNIT/ACT nasal spray Place 1 spray into the nose daily.    [provider]  Cholecalciferol 3000 UNITS TABS Take 3,000 Units by mouth daily.    [provider]  diazepam (VALIUM) 5 MG tablet Take 1 tablet (5 mg total) by mouth every 6 (six) hours as needed for anxiety (muscle spasms). 12/25/12   Ashok Pall, MD  diazepam (VALIUM) 5 MG tablet Take 1 tablet (5 mg total) by mouth  every 6 (six) hours as needed for anxiety (muscle spasms). 05/30/13   Newman Pies, MD  docusate sodium 100 MG CAPS Take 100 mg by mouth 2 (two) times daily. 05/30/13   Newman Pies, MD  furosemide (LASIX) 20 MG tablet Take 20 mg by mouth daily after breakfast.     [provider]  gabapentin (NEURONTIN) 300 MG capsule Take 300 mg by mouth 3 (three) times daily.     [provider]  ibuprofen (ADVIL,MOTRIN) 200 MG tablet Take 400 mg by mouth every 6 (six) hours as needed for pain.    [provider]  KRILL OIL PO Take 1 capsule by mouth daily. Hold while in hospital    [provider]  levothyroxine (LEVOXYL) 50 MCG tablet Take 50 mcg by mouth daily before breakfast.     [provider]  oxyCODONE-acetaminophen (PERCOCET) 10-325 MG per tablet Take 1 tablet by mouth every 4 (four) hours as needed for pain. 05/30/13   Newman Pies, MD  potassium chloride (MICRO-K) 10 MEQ CR capsule Take 10 mEq by mouth daily.    [provider]  pramipexole (MIRAPEX) 1.5 MG tablet Take 1.5 mg by mouth at bedtime.     [provider]    Physical Exam: Vitals:   10/26/19 0100 10/26/19 0200 10/26/19 0300 10/26/19 0325  BP: 129/66 (!) 154/76 (!) 143/71 (!) 143/71  Pulse: 64   65  Resp: 16 18 17 15   Temp:    97.8 F (36.6 C)  TempSrc:      SpO2: 100%   100%  Weight:      Height:        Constitutional: NAD, calm, comfortable Vitals:   10/26/19 0100 10/26/19 0200 10/26/19 0300 10/26/19 0325  BP: 129/66 (!) 154/76 (!) 143/71 (!) 143/71  Pulse: 64   65  Resp: 16 18 17 15   Temp:    97.8 F (36.6 C)  TempSrc:      SpO2: 100%   100%  Weight:      Height:       General: Patient is a pleasant 74 year old Caucasian female, laying comfortably in the bed Eyes: PERRL, lids and conjunctivae normal ENMT: Mucous membranes are moist. Posterior pharynx clear of any exudate or lesions.Normal dentition.  Neck: normal, supple, no masses, no  thyromegaly Respiratory: clear to auscultation bilaterally, no wheezing, no crackles. Normal respiratory effort. No accessory muscle use.  Cardiovascular: Regular rate and rhythm, no murmurs / rubs / gallops. No extremity edema. 2+ pedal pulses. No carotid bruits.  Abdomen: no tenderness, no masses palpated. No hepatosplenomegaly. Bowel sounds positive.  Musculoskeletal: Mild tenderness in ankle area.  Patient's right foot and right leg is covered with dressing.  Sensations intact to light touch.  Patient is able to move all of her toes. Skin: no rashes, lesions, ulcers. No induration Neurologic: CN 2-12 grossly intact. Sensation intact, DTR normal. Strength 5/5 in  all 4.  Psychiatric: Normal judgment and insight. Alert and oriented x 3. Normal mood.     Labs on Admission: I have personally reviewed following labs and imaging studies  CBC: Recent Labs  Lab 10/25/19 2031  WBC 8.0  NEUTROABS 6.9  HGB 12.7  HCT 38.4  MCV 91.0  PLT [id number]   Basic Metabolic Panel: Recent Labs  Lab 10/25/19 2031  NA 141  K 3.3*  CL 110  CO2 22  GLUCOSE 104*  BUN 15  CREATININE 0.68  CALCIUM 9.5   GFR: Estimated Creatinine Clearance: 43.6 mL/min (by C-G formula based on SCr of 0.68 mg/dL). Liver Function Tests: No results for input(s): AST, ALT, ALKPHOS, BILITOT, PROT, ALBUMIN in the last 168 hours. No results for input(s): LIPASE, AMYLASE in the last 168 hours. No results for input(s): AMMONIA in the last 168 hours. Coagulation Profile: No results for input(s): INR, PROTIME in the last 168 hours. Cardiac Enzymes: No results for input(s): CKTOTAL, CKMB, CKMBINDEX, TROPONINI in the last 168 hours. BNP (last 3 results) No results for input(s): PROBNP in the last 8760 hours. HbA1C: No results for input(s): HGBA1C in the last 72 hours. CBG: No results for input(s): GLUCAP in the last 168 hours. Lipid Profile: No results for input(s): CHOL, HDL, LDLCALC, TRIG, CHOLHDL, LDLDIRECT in the last 72  hours. Thyroid Function Tests: No results for input(s): TSH, T4TOTAL, FREET4, T3FREE, THYROIDAB in the last 72 hours. Anemia Panel: No results for input(s): VITAMINB12, FOLATE, FERRITIN, TIBC, IRON, RETICCTPCT in the last 72 hours. Urine analysis: No results found for: COLORURINE, APPEARANCEUR, LABSPEC, PHURINE, GLUCOSEU, HGBUR, BILIRUBINUR, KETONESUR, PROTEINUR, UROBILINOGEN, NITRITE, LEUKOCYTESUR  Radiological Exams on Admission: DG Chest 1 View  Result Date: 10/25/2019 CLINICAL DATA:  Preoperative radiograph. EXAM: CHEST  1 VIEW COMPARISON:  December 23, 2012 FINDINGS: Cardiomediastinal silhouette is normal. Mediastinal contours appear intact. 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 There is no evidence of focal airspace consolidation, pleural effusion or pneumothorax. Osseous structures are without acute abnormality. Soft tissues are grossly normal. IMPRESSION: 1. 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 Images from patient's prior chest CT angiogram dated March 14, 2001 are not available for comparison. According to the report, the distal thoracic aneurysm measured 3.9 cm in maximum diameter. Further evaluation with CT angiogram of the chest may be considered for proper evaluation. 2. No evidence of acute cardiopulmonary disease. Electronically Signed   By: Fidela Salisbury M.D.   On: 10/25/2019 20:57   DG Ankle Complete Right  Result Date: 10/25/2019 CLINICAL DATA:  74 year old female with fall and right lower extremity pain. EXAM: RIGHT ANKLE - COMPLETE 3+ VIEW COMPARISON:  None. FINDINGS: There is a mildly displaced spiral fracture of the distal tibial diaphysis with approximately 5 mm lateral displacement of the distal fracture fragment. Minimally displaced oblique fracture of the  distal fibular diaphysis. No dislocation. The bones are osteopenic. Mild diffuse subcutaneous edema. IMPRESSION: 1. Fractures of the distal tibia and fibula. 2. Osteopenia. Electronically Signed   By: Anner Crete M.D.   On: 10/25/2019 19:31      Assessment/Plan Active Problems:   Tibia/fibula fracture, shaft, right, closed, initial encounter  Orthopedic consultation ordered by the ED physician and patient will have ORIF in the morning . Pain medications according to the pain scale as needed ordered. All as needed for mild pain, Norco as  needed for moderate while IV Dilaudid 0.5 mg every 4 hours as needed for severe pain ordered. N.p.o. after midnight. EKG and rest of labs within normal limits.  Hypertension Elevated hypertension on arrival to the hospital secondary to severe pain that improved later on 110/65. Continue to monitor.         DVT prophylaxis:  Code Status: Full code  Consults called: Orthopedics/ Dr. Lorin Mercy Admission status: Inpatient/MedSurg   Edmonia Lynch MD [facility] Pager   If 7PM-7AM, please contact night-coverage [email or web address] Password   10/26/2019, 4:32 AM

## 2019-10-26 NOTE — Plan of Care (Signed)
74 year old female admitted this morning right ankle pain.  She slipped while coming out of the vehicle.  X-ray showed fracture of the distal tibia and fibula.  She has history of arthritis hypertension hypothyroidism.  She is now status post reduction and insertion of right tibia intramedullary nail.  50% weightbearing with walker postop.  Will follow.

## 2019-10-26 NOTE — Progress Notes (Signed)
Occupational Therapy Evaluation Patient Details Name: Donna Alvarez MRN: [id number] DOB: May 10, 1946 Today's Date: 10/26/2019    History of Present Illness 74 year old female community ambulator with history of hypertension and also hypothyroidism slipped and fell getting out of the vehicle suffered a right tib-fib fracture, shaft, closed and displaced. s/p right IM nail.    Clinical Impression   PTA, pt was living at home with her husband, pt was independent with ADL/IADL and functional mobility, she was driving and grocery shopping. Pt currently requires minguard-minA for ADL completion and functional mobility at RW level. She required intermittent vc for weight bearing precaution. Due to decline in current level of function, pt would benefit from acute OT to address established goals to facilitate safe D/C to venue listed below. At this time, recommend HHOT follow-up. Will continue to follow acutely.     Follow Up Recommendations  Home health OT;Supervision - Intermittent    Equipment Recommendations  3 in 1 bedside commode    Recommendations for Other Services       Precautions / Restrictions Precautions Precautions: Fall Required Braces or Orthoses: Other Brace(CAM walker boot) Other Brace: CAM walker boot Restrictions Weight Bearing Restrictions: Yes RLE Weight Bearing: Partial weight bearing RLE Partial Weight Bearing Percentage or Pounds: 50% with RW Other Position/Activity Restrictions: CAM walker boot      Mobility Bed Mobility Overal bed mobility: Needs Assistance Bed Mobility: Supine to Sit;Sit to Supine     Supine to sit: Min assist;HOB elevated Sit to supine: Min assist;HOB elevated   General bed mobility comments: minA for RLE managment  Transfers Overall transfer level: Needs assistance Equipment used: Rolling walker (2 wheeled) Transfers: Sit to/from Stand Sit to Stand: Min assist         General transfer comment: minA for powerup and  stability in standing    Balance Overall balance assessment: Needs assistance Sitting-balance support: No upper extremity supported;Feet supported Sitting balance-Leahy Scale: Fair     Standing balance support: Bilateral upper extremity supported;Single extremity supported;During functional activity Standing balance-Leahy Scale: Fair Standing balance comment: BUE support during mobiltiy;single UE support during ADL to maintain stability and PWB precaution                           ADL either performed or assessed with clinical judgement   ADL Overall ADL's : Needs assistance/impaired Eating/Feeding: Set up;Sitting   Grooming: Minimal assistance;Standing   Upper Body Bathing: Min guard;Sitting   Lower Body Bathing: Minimal assistance;Sit to/from stand   Upper Body Dressing : Min guard;Sitting   Lower Body Dressing: Minimal assistance;Sit to/from stand   Toilet Transfer: Minimal assistance;Ambulation;RW;Cueing for sequencing Toilet Transfer Details (indicate cue type and reason): simulated in room Toileting- Clothing Manipulation and Hygiene: Minimal assistance;Sit to/from stand       Functional mobility during ADLs: Min guard;Minimal assistance;Rolling walker General ADL Comments: pt limited by weight bearing precaution     Vision Baseline Vision/History: Wears glasses Patient Visual Report: No change from baseline Vision Assessment?: No apparent visual deficits     Perception     Praxis      Pertinent Vitals/Pain Pain Assessment: 0-10 Pain Score: 6  Pain Location: RLE  Pain Descriptors / Indicators: Constant;Sore Pain Intervention(s): Limited activity within patient's tolerance;Monitored during session     Hand Dominance Right   Extremity/Trunk Assessment Upper Extremity Assessment Upper Extremity Assessment: Overall WFL for tasks assessed   Lower Extremity Assessment Lower Extremity Assessment: RLE deficits/detail RLE  Deficits / Details: PWB  50% with CAM walker boot   Cervical / Trunk Assessment Cervical / Trunk Assessment: Normal   Communication Communication Communication: No difficulties   Cognition Arousal/Alertness: Awake/alert Behavior During Therapy: WFL for tasks assessed/performed Overall Cognitive Status: Within Functional Limits for tasks assessed                                 General Comments: pt demonstrates good awareness of PWB precautions   General Comments  vss    Exercises     Shoulder Instructions      Home Living Family/patient expects to be discharged to:: Private residence Living Arrangements: Spouse/significant other;Children Available Help at Discharge: Family;Available 24 hours/day Type of Home: House Home Access: Stairs to enter [employer] of Steps: 3 Entrance Stairs-Rails: None Home Layout: Able to live on main level with bedroom/bathroom;Two level     Bathroom Shower/Tub: Occupational psychologist: Standard     Home Equipment: Environmental consultant - 2 wheels;Cane - single point   Additional Comments: pt reports she plans to d/c to her daughter's house and she is available 24/7, pts husband will be there as well      Prior Functioning/Environment Level of Independence: Independent        Comments: pt was driving, grocery shopping, totally independent        OT Problem List: Decreased activity tolerance;Impaired balance (sitting and/or standing);Decreased safety awareness;Decreased knowledge of precautions;Pain      OT Treatment/Interventions: Self-care/ADL training;Therapeutic exercise;Energy conservation;DME and/or AE instruction;Therapeutic activities;Patient/family education;Balance training    OT Goals(Current goals can be found in the care plan section) Acute Rehab OT Goals Patient Stated Goal: to get stronger and go home OT Goal Formulation: With patient Time For Goal Achievement: 11/09/19 Potential to Achieve Goals: Good ADL Goals Pt  Will Perform Grooming: with supervision;standing;sitting Pt Will Perform Lower Body Dressing: with supervision;sit to/from stand Pt Will Transfer to Toilet: with supervision;ambulating Pt Will Perform Tub/Shower Transfer: with supervision  OT Frequency: Min 2X/week   Barriers to D/C:    pt reports plan is to d/c to daughters home       Co-evaluation              AM-PAC OT "6 Clicks" Daily Activity     Outcome Measure Help from another person eating meals?: A Little Help from another person taking care of personal grooming?: A Little Help from another person toileting, which includes using toliet, bedpan, or urinal?: A Little Help from another person bathing (including washing, rinsing, drying)?: A Little Help from another person to put on and taking off regular upper body clothing?: A Little Help from another person to put on and taking off regular lower body clothing?: A Little 6 Click Score: 18   End of Session Equipment Utilized During Treatment: Gait belt;Rolling walker;Other (comment)(CAM boot) Nurse Communication: Mobility status  Activity Tolerance: Patient tolerated treatment well Patient left: in bed;with call bell/phone within reach;with bed alarm set  OT Visit Diagnosis: Other abnormalities of gait and mobility (R26.89);Pain;History of falling (Z91.81) Pain - Right/Left: Right Pain - part of body: Leg                Time: 1543-1600 OT Time Calculation (min): 17 min Charges:  OT General Charges $OT Visit: 1 Visit OT Evaluation $OT Eval Moderate Complexity: Acacia Villas OTR/L Acute Rehabilitation Services Office: [place]  10/26/2019, 4:56 PM

## 2019-10-26 NOTE — Op Note (Signed)
Preop diagnosis: Right closed displaced tibia fibula shaft fracture.  Postoperative diagnosis: Same  Procedure: Reduction and insertion of right tibia intramedullary nail (Zimmer recon 11 x 300 mm nail with proximal and distal interlocks.)  Anesthesia: General LMA.  Plus Marcaine local.  Tourniquet: 300 x 39 minutes.  Procedure: After induction general anesthesia preoperative antibiotics which was clindamycin due to patient's history of cephalexin hives with p.o. Keflex patient proximal thigh tourniquet applied standard prepping draping from the tourniquet to the toes.  Timeout procedure was completed after split sheets drapes were applied sterile glove over the toes.  Triangle was used extra padding applied leg was elevated tourniquet inflated after being wrapped with an Esmarch.  Incision was made adjacent to the medial aspect of the patellar tendon.  At the beveled edge extra-articular of the proximal portion of the tibia pin was placed checked under C arm overreamed and then the beaded tip probe was placed down to the ankle measured.  It was 31.5 and a 300 mm nail was selected.  Progressive reaming to 12 insertion 11 mm nail with reduction performed.  Proximal interlocks were placed with the guide transverse static and 1 oblique.  2 distal screws were placed from medial lateral freehand technique which were 40 and 74mm screws.  Final spot pictures were taken confirming screws went through the nail grab both cortexes.  Tourniquet was deflated irrigation standard closure #1 Vicryl deep fascia 2 and subtenons tissue skin staple closure postop dressing with Xeroform 4 x 4's ABD web roll and Ace wrap.  Patient placed in a cam boot in the PACU.  Postop plan is 50% weightbearing with walker.  She could go home on plain aspirin 1 daily for DVT prophylaxis.  Also follow-up with Dr. Lorin Mercy in 1 week.

## 2019-10-26 NOTE — Progress Notes (Signed)
Per PACU RN, at bedside, giving report.

## 2019-10-26 NOTE — Anesthesia Preprocedure Evaluation (Signed)
Anesthesia Evaluation  Patient identified by MRN, date of birth, ID band Patient awake    Reviewed: Allergy & Precautions, NPO status , Patient's Chart, lab work & pertinent test results  History of Anesthesia Complications (+) PROLONGED EMERGENCE  Airway Mallampati: I       Dental no notable dental hx. (+) Teeth Intact   Pulmonary neg pulmonary ROS,    Pulmonary exam normal breath sounds clear to auscultation       Cardiovascular hypertension, Pt. on medications Normal cardiovascular exam Rhythm:Regular Rate:Normal     Neuro/Psych    GI/Hepatic negative GI ROS, Neg liver ROS,   Endo/Other  Hypothyroidism   Renal/GU   negative genitourinary   Musculoskeletal   Abdominal Normal abdominal exam  (+)   Peds  Hematology  (+) Blood dyscrasia, anemia ,   Anesthesia Other Findings   Reproductive/Obstetrics                             Anesthesia Physical Anesthesia Plan  ASA: II  Anesthesia Plan: General   Post-op Pain Management:    Induction: Intravenous  PONV Risk Score and Plan: 4 or greater and Ondansetron, Dexamethasone and Treatment may vary due to age or medical condition  Airway Management Planned: LMA  Additional Equipment: None  Intra-op Plan:   Post-operative Plan: Extubation in OR  Informed Consent: I have reviewed the patients History and Physical, chart, labs and discussed the procedure including the risks, benefits and alternatives for the proposed anesthesia with the patient or authorized representative who has indicated his/her understanding and acceptance.     Dental advisory given  Plan Discussed with: CRNA  Anesthesia Plan Comments:         Anesthesia Quick Evaluation

## 2019-10-26 NOTE — Progress Notes (Signed)
Orthopedic Tech Progress Note Patient Details:  Donna Alvarez 09/06/1946 [id number]  Ortho Devices Type of Ortho Device: CAM walker Ortho Device/Splint Location: RLE Ortho Device/Splint Interventions: Ordered, Application   Post Interventions Patient Tolerated: Well Instructions Provided: Care of device, Adjustment of device, Poper ambulation with device   Staci Righter 10/26/2019, 3:21 PM

## 2019-10-26 NOTE — Anesthesia Procedure Notes (Signed)
Procedure Name: LMA Insertion Date/Time: 10/26/2019 11:33 AM Performed by: Gerald Leitz, CRNA Pre-anesthesia Checklist: Patient identified, Patient being monitored, Timeout performed, Emergency Drugs available and Suction available Patient Re-evaluated:Patient Re-evaluated prior to induction Oxygen Delivery Method: Circle system utilized Preoxygenation: Pre-oxygenation with 100% oxygen Induction Type: IV induction Ventilation: Mask ventilation without difficulty LMA: LMA inserted and LMA with gastric port inserted LMA Size: 3.0 Tube type: Oral Number of attempts: 2 (small oropharynx; difficult to make bend with LMA) Placement Confirmation: positive ETCO2 and breath sounds checked- equal and bilateral Tube secured with: Tape Dental Injury: Teeth and Oropharynx as per pre-operative assessment

## 2019-10-26 NOTE — Consult Note (Signed)
Reason for Consult:fall with right tib fib shaft  Fx, closed, displaced.  Referring Physician: Dr. Coralee Pesa  Donna Alvarez is an 74 y.o. female.  HPI: 74 year old female community ambulator with history of hypertension and also hypothyroidism slipped and fell getting out of the vehicle suffered a right tib-fib fracture, shaft, closed and displaced.  She is brought by EMS where x-rays demonstrated tib-fib fracture on the right.  Patient had left total knee arthroplasty in the past with minimal problems and with walking without her walker after 2 days.  She had microdiscectomy Dr. Arnoldo Morale 2014.  Past Medical History:  Diagnosis Date  . Arthritis    hands, +low back   . 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 Chronic kidney disease    kidney stones - 2013- treated /w surg. procedure   . Complication of anesthesia    with anesth. 10/2010- very emotional & slow to wake up, 12/2010- no problems  . Dysrhythmia   . Hernia   . Hypertension   . Hypothyroidism   . Neuromuscular disorder ([place])    uses Mirapex for RLS  . Thyroid disease     Past Surgical History:  Procedure Laterality Date  . childbirth     x2   . CYSTOSCOPY     for stone removal   . HERNIA REPAIR     12/2010- both inguinal & umbilical done at same time  . KNEE ARTHROSCOPY  10/2010   L  . LUMBAR LAMINECTOMY/DECOMPRESSION MICRODISCECTOMY Right 12/25/2012   Procedure: LUMBAR LAMINECTOMY/DECOMPRESSION MICRODISCECTOMY 1 LEVEL;  Surgeon: Ophelia Charter, MD;  Location: [facility];  Service: Neurosurgery;  Laterality: Right;  RIGHT Lumbar five sacral one diskectomy  . LUMBAR LAMINECTOMY/DECOMPRESSION MICRODISCECTOMY Right 05/29/2013   Procedure: LUMBAR LAMINECTOMY/DECOMPRESSION MICRODISCECTOMY 1 LEVEL,RIGHT LUMBAR FIVE-SACRAL ONE;  Surgeon: Ophelia Charter, MD;  Location: [facility];  Service: Neurosurgery;  Laterality: Right;  RIGHT  . MELANOMA EXCISION     posterior area of R leg.   Marland Kitchen TORN CARTILAGE  2012    Family History  Problem Relation Age of Onset  . Cancer Father   . Hypertension Father     Social History:  reports that she has never smoked. She does not have any smokeless tobacco history on file. She reports that she does not drink alcohol or use drugs.  Allergies:  Allergies  Allergen Reactions  . Cephalexin Hives  . Doxycycline Rash    Blisters to toes and hands Blisters to toes and hands   . Risedronate Other (See Comments)    Bone pain Bone pain   . Sulfamethoxazole-Trimethoprim Other (See Comments)    Medications: I have reviewed the patient's current medications.  Results for orders placed or performed during the hospital encounter of 10/25/19 (from the past 48 hour(s))  CBC with Differential     Status: Abnormal   Collection Time: 10/25/19  8:31 PM  Result Value Ref Range   WBC 8.0 4.0 - 10.5 K/uL   RBC 4.22 3.87 - 5.11 MIL/uL   Hemoglobin 12.7 12.0 - 15.0 g/dL   HCT 38.4 36.0 - 46.0 %   MCV 91.0 80.0 - 100.0 fL   MCH 30.1 26.0 - 34.0 pg   MCHC 33.1 30.0 - 36.0 g/dL   RDW 13.4 11.5 - 15.5 %   Platelets 252 150 - 400 K/uL   nRBC 0.0 0.0 - 0.2 %   Neutrophils Relative % 86 %  Neutro Abs 6.9 1.7 - 7.7 K/uL   Lymphocytes Relative 8 %   Lymphs Abs 0.6 (L) 0.7 - 4.0 K/uL   Monocytes Relative 4 %   Monocytes Absolute 0.3 0.1 - 1.0 K/uL   Eosinophils Relative 1 %   Eosinophils Absolute 0.1 0.0 - 0.5 K/uL   Basophils Relative 1 %   Basophils Absolute 0.1 0.0 - 0.1 K/uL   Immature Granulocytes 0 %   Abs Immature Granulocytes 0.03 0.00 - 0.07 K/uL    Comment: Performed at [facility], [place] [address], [place], [place] [id number]  Basic metabolic panel     Status: Abnormal   Collection Time: 10/25/19  8:31 PM  Result Value Ref Range   Sodium 141 135 - 145 mmol/L   Potassium 3.3 (L) 3.5 - 5.1 mmol/L   Chloride 110 98 - 111 mmol/L   CO2 22 22 - 32 mmol/L   Glucose, Bld 104 (H) 70 - 99 mg/dL   BUN 15 8 - 23  mg/dL   Creatinine, Ser 0.68 0.44 - 1.00 mg/dL   Calcium 9.5 8.9 - 10.3 mg/dL   GFR calc non Af Amer >60 >60 mL/min   GFR calc Af Amer >60 >60 mL/min   Anion gap 9 5 - 15    Comment: Performed at [facility], [place] [address], [place], [place] [address]  Respiratory Panel by RT PCR (Flu A&B, Covid) - Nasopharyngeal Swab     Status: None   Collection Time: 10/25/19  8:31 PM   Specimen: Nasopharyngeal Swab  Result Value Ref Range   SARS Coronavirus 2 by RT PCR NEGATIVE NEGATIVE    Comment: (NOTE) SARS-CoV-2 target nucleic acids are NOT DETECTED. The SARS-CoV-2 RNA is generally detectable in upper respiratoy specimens during the acute phase of infection. The lowest concentration of SARS-CoV-2 viral copies this assay can detect is 131 copies/mL. A negative result does not preclude SARS-Cov-2 infection and should not be used as the sole basis for treatment or other patient management decisions. A negative result may occur with  improper specimen collection/handling, submission of specimen other than nasopharyngeal swab, presence of viral mutation(s) within the areas targeted by this assay, and inadequate number of viral copies (<131 copies/mL). A negative result must be combined with clinical observations, patient history, and epidemiological information. The expected result is Negative. Fact Sheet for Patients:  PinkCheek.be Fact Sheet for Healthcare Providers:  GravelBags.it This test is not yet ap proved or cleared by the [place] FDA and  has been authorized for detection and/or diagnosis of SARS-CoV-2 by FDA under an Emergency Use Authorization (EUA). This EUA will remain  in effect (meaning this test can be used) for the duration of the COVID-19 declaration under Section 564(b)(1) of the Act, 21 U.S.C. section 360bbb-3(b)(1), unless the authorization is terminated or revoked sooner.    Influenza A  by PCR NEGATIVE NEGATIVE   Influenza B by PCR NEGATIVE NEGATIVE    Comment: (NOTE) The Xpert Xpress SARS-CoV-2/FLU/RSV assay is intended as an aid in  the diagnosis of influenza from Nasopharyngeal swab specimens and  should not be used as a sole basis for treatment. Nasal washings and  aspirates are unacceptable for Xpert Xpress SARS-CoV-2/FLU/RSV  testing. Fact Sheet for Patients: PinkCheek.be Fact Sheet for Healthcare Providers: GravelBags.it This test is not yet approved or cleared by the [place] FDA and  has been authorized for detection and/or diagnosis of SARS-CoV-2 by  FDA under an Emergency Use Authorization (EUA).  This EUA will remain  in effect (meaning this test can be used) for the duration of the  Covid-19 declaration under Section 564(b)(1) of the Act, 21  U.S.C. section 360bbb-3(b)(1), unless the authorization is  terminated or revoked. Performed at [facility], [place] [address] [place], [place] [address]     DG Chest 1 View  Result Date: 10/25/2019 CLINICAL DATA:  Preoperative radiograph. EXAM: CHEST  1 VIEW COMPARISON:  December 23, 2012 FINDINGS: Cardiomediastinal silhouette is normal. Mediastinal contours appear intact. 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 There is no evidence of focal airspace consolidation, pleural effusion or pneumothorax. Osseous structures are without acute abnormality. Soft tissues are grossly normal. IMPRESSION: 1. 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 Images from patient's prior chest CT angiogram dated March 14, 2001 are not available for comparison. According to the report, the distal thoracic aneurysm measured 3.9 cm in maximum diameter. Further  evaluation with CT angiogram of the chest may be considered for proper evaluation. 2. No evidence of acute cardiopulmonary disease. Electronically Signed   By: Fidela Salisbury M.D.   On: 10/25/2019 20:57   DG Ankle Complete Right  Result Date: 10/25/2019 CLINICAL DATA:  74 year old female with fall and right lower extremity pain. EXAM: RIGHT ANKLE - COMPLETE 3+ VIEW COMPARISON:  None. FINDINGS: There is a mildly displaced spiral fracture of the distal tibial diaphysis with approximately 5 mm lateral displacement of the distal fracture fragment. Minimally displaced oblique fracture of the distal fibular diaphysis. No dislocation. The bones are osteopenic. Mild diffuse subcutaneous edema. IMPRESSION: 1. Fractures of the distal tibia and fibula. 2. Osteopenia. Electronically Signed   By: Anner Crete M.D.   On: 10/25/2019 19:31    ROS recent squamous cell carcinoma removed left leg.  Left total knee arthroplasty in the past.  Decompression surgery lumbar 2014.  Positive hypertension hypothyroidism.  History of kidney stones no current symptoms.  Otherwise negative is obtains HPI. Blood pressure (!) 142/69, pulse 77, temperature 97.8 F (36.6 C), resp. rate (!) 25, height 5' (1.524 m), weight 47.6 kg, SpO2 100 %. Physical Exam  Constitutional: She is oriented to person, place, and time. She appears well-developed and well-nourished.  Thin cooperative alert conversant.  HENT:  Head: Normocephalic.  Glasses  Eyes: Pupils are equal, round, and reactive to light. Conjunctivae are normal.  Neck: No thyromegaly present.  Cardiovascular: Normal rate and regular rhythm.  Respiratory: Effort normal.  GI: Soft.  Musculoskeletal:     Cervical back: Normal range of motion and neck supple.  Neurological: She is alert and oriented to person, place, and time.  Skin: Skin is warm and dry.  Psychiatric: She has a normal mood and affect. Her behavior is normal. Judgment and thought content normal.     Assessment/Plan: Patient with closed displaced tib-fib shaft fracture, right.  Plan intramedullary nail we discussed incision operative technique 50% weightbearing postoperatively.  She states she be staying with her daughter after the surgery.  She is already used a walker in the past.  Risk surgery discussed including nonunion, DVT reoperation questions elicited and answered she understands close to proceed.  [name] 10/26/2019, 9:30 AM

## 2019-10-26 NOTE — Interval H&P Note (Signed)
History and Physical Interval Note:  10/26/2019 11:07 AM  Donna Alvarez  has presented today for surgery, with the diagnosis of Right tibial and fibula fracture.  The various methods of treatment have been discussed with the patient and family. After consideration of risks, benefits and other options for treatment, the patient has consented to  Procedure(s): INTRAMEDULLARY (IM) NAIL TIBIAL (Right) as a surgical intervention.  The patient's history has been reviewed, patient examined, no change in status, stable for surgery.  I have reviewed the patient's chart and labs.  Questions were answered to the patient's satisfaction.     [name]

## 2019-10-26 NOTE — H&P (View-Only) (Signed)
Reason for Consult:fall with right tib fib shaft  Fx, closed, displaced.  Referring Physician: Dr. Coralee Pesa  Donna Alvarez is an 74 y.o. female.  HPI: 74 year old female community ambulator with history of hypertension and also hypothyroidism slipped and fell getting out of the vehicle suffered a right tib-fib fracture, shaft, closed and displaced.  She is brought by EMS where x-rays demonstrated tib-fib fracture on the right.  Patient had left total knee arthroplasty in the past with minimal problems and with walking without her walker after 2 days.  She had microdiscectomy Dr. Arnoldo Morale 2014.  Past Medical History:  Diagnosis Date  . Arthritis    hands, +low back   . 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 Chronic kidney disease    kidney stones - 2013- treated /w surg. procedure   . Complication of anesthesia    with anesth. 10/2010- very emotional & slow to wake up, 12/2010- no problems  . Dysrhythmia   . Hernia   . Hypertension   . Hypothyroidism   . Neuromuscular disorder ([place])    uses Mirapex for RLS  . Thyroid disease     Past Surgical History:  Procedure Laterality Date  . childbirth     x2   . CYSTOSCOPY     for stone removal   . HERNIA REPAIR     12/2010- both inguinal & umbilical done at same time  . KNEE ARTHROSCOPY  10/2010   L  . LUMBAR LAMINECTOMY/DECOMPRESSION MICRODISCECTOMY Right 12/25/2012   Procedure: LUMBAR LAMINECTOMY/DECOMPRESSION MICRODISCECTOMY 1 LEVEL;  Surgeon: Ophelia Charter, MD;  Location: [facility];  Service: Neurosurgery;  Laterality: Right;  RIGHT Lumbar five sacral one diskectomy  . LUMBAR LAMINECTOMY/DECOMPRESSION MICRODISCECTOMY Right 05/29/2013   Procedure: LUMBAR LAMINECTOMY/DECOMPRESSION MICRODISCECTOMY 1 LEVEL,RIGHT LUMBAR FIVE-SACRAL ONE;  Surgeon: Ophelia Charter, MD;  Location: [facility];  Service: Neurosurgery;  Laterality: Right;  RIGHT  . MELANOMA EXCISION     posterior area of R leg.   Marland Kitchen TORN CARTILAGE  2012    Family History  Problem Relation Age of Onset  . Cancer Father   . Hypertension Father     Social History:  reports that she has never smoked. She does not have any smokeless tobacco history on file. She reports that she does not drink alcohol or use drugs.  Allergies:  Allergies  Allergen Reactions  . Cephalexin Hives  . Doxycycline Rash    Blisters to toes and hands Blisters to toes and hands   . Risedronate Other (See Comments)    Bone pain Bone pain   . Sulfamethoxazole-Trimethoprim Other (See Comments)    Medications: I have reviewed the patient's current medications.  Results for orders placed or performed during the hospital encounter of 10/25/19 (from the past 48 hour(s))  CBC with Differential     Status: Abnormal   Collection Time: 10/25/19  8:31 PM  Result Value Ref Range   WBC 8.0 4.0 - 10.5 K/uL   RBC 4.22 3.87 - 5.11 MIL/uL   Hemoglobin 12.7 12.0 - 15.0 g/dL   HCT 38.4 36.0 - 46.0 %   MCV 91.0 80.0 - 100.0 fL   MCH 30.1 26.0 - 34.0 pg   MCHC 33.1 30.0 - 36.0 g/dL   RDW 13.4 11.5 - 15.5 %   Platelets 252 150 - 400 K/uL   nRBC 0.0 0.0 - 0.2 %   Neutrophils Relative % 86 %  Neutro Abs 6.9 1.7 - 7.7 K/uL   Lymphocytes Relative 8 %   Lymphs Abs 0.6 (L) 0.7 - 4.0 K/uL   Monocytes Relative 4 %   Monocytes Absolute 0.3 0.1 - 1.0 K/uL   Eosinophils Relative 1 %   Eosinophils Absolute 0.1 0.0 - 0.5 K/uL   Basophils Relative 1 %   Basophils Absolute 0.1 0.0 - 0.1 K/uL   Immature Granulocytes 0 %   Abs Immature Granulocytes 0.03 0.00 - 0.07 K/uL    Comment: Performed at [facility], [place] [address], [place], [place] [id number]  Basic metabolic panel     Status: Abnormal   Collection Time: 10/25/19  8:31 PM  Result Value Ref Range   Sodium 141 135 - 145 mmol/L   Potassium 3.3 (L) 3.5 - 5.1 mmol/L   Chloride 110 98 - 111 mmol/L   CO2 22 22 - 32 mmol/L   Glucose, Bld 104 (H) 70 - 99 mg/dL   BUN 15 8 - 23  mg/dL   Creatinine, Ser 0.68 0.44 - 1.00 mg/dL   Calcium 9.5 8.9 - 10.3 mg/dL   GFR calc non Af Amer >60 >60 mL/min   GFR calc Af Amer >60 >60 mL/min   Anion gap 9 5 - 15    Comment: Performed at [facility], [place] [address], [place], [place] [address]  Respiratory Panel by RT PCR (Flu A&B, Covid) - Nasopharyngeal Swab     Status: None   Collection Time: 10/25/19  8:31 PM   Specimen: Nasopharyngeal Swab  Result Value Ref Range   SARS Coronavirus 2 by RT PCR NEGATIVE NEGATIVE    Comment: (NOTE) SARS-CoV-2 target nucleic acids are NOT DETECTED. The SARS-CoV-2 RNA is generally detectable in upper respiratoy specimens during the acute phase of infection. The lowest concentration of SARS-CoV-2 viral copies this assay can detect is 131 copies/mL. A negative result does not preclude SARS-Cov-2 infection and should not be used as the sole basis for treatment or other patient management decisions. A negative result may occur with  improper specimen collection/handling, submission of specimen other than nasopharyngeal swab, presence of viral mutation(s) within the areas targeted by this assay, and inadequate number of viral copies (<131 copies/mL). A negative result must be combined with clinical observations, patient history, and epidemiological information. The expected result is Negative. Fact Sheet for Patients:  PinkCheek.be Fact Sheet for Healthcare Providers:  GravelBags.it This test is not yet ap proved or cleared by the [place] FDA and  has been authorized for detection and/or diagnosis of SARS-CoV-2 by FDA under an Emergency Use Authorization (EUA). This EUA will remain  in effect (meaning this test can be used) for the duration of the COVID-19 declaration under Section 564(b)(1) of the Act, 21 U.S.C. section 360bbb-3(b)(1), unless the authorization is terminated or revoked sooner.    Influenza A  by PCR NEGATIVE NEGATIVE   Influenza B by PCR NEGATIVE NEGATIVE    Comment: (NOTE) The Xpert Xpress SARS-CoV-2/FLU/RSV assay is intended as an aid in  the diagnosis of influenza from Nasopharyngeal swab specimens and  should not be used as a sole basis for treatment. Nasal washings and  aspirates are unacceptable for Xpert Xpress SARS-CoV-2/FLU/RSV  testing. Fact Sheet for Patients: PinkCheek.be Fact Sheet for Healthcare Providers: GravelBags.it This test is not yet approved or cleared by the [place] FDA and  has been authorized for detection and/or diagnosis of SARS-CoV-2 by  FDA under an Emergency Use Authorization (EUA).  This EUA will remain  in effect (meaning this test can be used) for the duration of the  Covid-19 declaration under Section 564(b)(1) of the Act, 21  U.S.C. section 360bbb-3(b)(1), unless the authorization is  terminated or revoked. Performed at [facility], [place] [address], [place], [place] [address]     DG Chest 1 View  Result Date: 10/25/2019 CLINICAL DATA:  Preoperative radiograph. EXAM: CHEST  1 VIEW COMPARISON:  December 23, 2012 FINDINGS: Cardiomediastinal silhouette is normal. Mediastinal contours appear intact. 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 There is no evidence of focal airspace consolidation, pleural effusion or pneumothorax. Osseous structures are without acute abnormality. Soft tissues are grossly normal. IMPRESSION: 1. 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 Images from patient's prior chest CT angiogram dated March 14, 2001 are not available for comparison. According to the report, the distal thoracic aneurysm measured 3.9 cm in maximum diameter. Further  evaluation with CT angiogram of the chest may be considered for proper evaluation. 2. No evidence of acute cardiopulmonary disease. Electronically Signed   By: Fidela Salisbury M.D.   On: 10/25/2019 20:57   DG Ankle Complete Right  Result Date: 10/25/2019 CLINICAL DATA:  74 year old female with fall and right lower extremity pain. EXAM: RIGHT ANKLE - COMPLETE 3+ VIEW COMPARISON:  None. FINDINGS: There is a mildly displaced spiral fracture of the distal tibial diaphysis with approximately 5 mm lateral displacement of the distal fracture fragment. Minimally displaced oblique fracture of the distal fibular diaphysis. No dislocation. The bones are osteopenic. Mild diffuse subcutaneous edema. IMPRESSION: 1. Fractures of the distal tibia and fibula. 2. Osteopenia. Electronically Signed   By: Anner Crete M.D.   On: 10/25/2019 19:31    ROS recent squamous cell carcinoma removed left leg.  Left total knee arthroplasty in the past.  Decompression surgery lumbar 2014.  Positive hypertension hypothyroidism.  History of kidney stones no current symptoms.  Otherwise negative is obtains HPI. Blood pressure (!) 142/69, pulse 77, temperature 97.8 F (36.6 C), resp. rate (!) 25, height 5' (1.524 m), weight 47.6 kg, SpO2 100 %. Physical Exam  Constitutional: She is oriented to person, place, and time. She appears well-developed and well-nourished.  Thin cooperative alert conversant.  HENT:  Head: Normocephalic.  Glasses  Eyes: Pupils are equal, round, and reactive to light. Conjunctivae are normal.  Neck: No thyromegaly present.  Cardiovascular: Normal rate and regular rhythm.  Respiratory: Effort normal.  GI: Soft.  Musculoskeletal:     Cervical back: Normal range of motion and neck supple.  Neurological: She is alert and oriented to person, place, and time.  Skin: Skin is warm and dry.  Psychiatric: She has a normal mood and affect. Her behavior is normal. Judgment and thought content normal.     Assessment/Plan: Patient with closed displaced tib-fib shaft fracture, right.  Plan intramedullary nail we discussed incision operative technique 50% weightbearing postoperatively.  She states she be staying with her daughter after the surgery.  She is already used a walker in the past.  Risk surgery discussed including nonunion, DVT reoperation questions elicited and answered she understands close to proceed.  [name] 10/26/2019, 9:30 AM

## 2019-10-26 NOTE — ED Notes (Signed)
Report given to [place]-will transport to 3 floor

## 2019-10-27 ENCOUNTER — Encounter (HOSPITAL_COMMUNITY): Payer: Self-pay | Admitting: Internal Medicine

## 2019-10-27 DIAGNOSIS — I10 Essential (primary) hypertension: Secondary | ICD-10-CM

## 2019-10-27 MED ORDER — ASPIRIN 325 MG PO TABS: 325.0000 mg | ORAL_TABLET | Freq: Every day | ORAL | Status: DC

## 2019-10-27 MED ORDER — OXYCODONE-ACETAMINOPHEN 5-325 MG PO TABS: 1.0000 | ORAL_TABLET | Freq: Four times a day (QID) | ORAL | 0 refills | Status: AC | PRN

## 2019-10-27 MED ORDER — METHOCARBAMOL 500 MG PO TABS: 500.0000 mg | ORAL_TABLET | Freq: Three times a day (TID) | ORAL | 0 refills | Status: DC | PRN

## 2019-10-27 MED ORDER — OXYCODONE HCL 5 MG PO TABS
5.0000 mg | ORAL_TABLET | ORAL | Status: DC | PRN
  Administered 2019-10-27 – 2019-10-28 (×3): 5 mg via ORAL
  Filled 2019-10-27 (×3): qty 1

## 2019-10-27 NOTE — Progress Notes (Signed)
PROGRESS NOTE    Donna Alvarez  [id number] DOB: Feb 28, 1946 DOA: 10/25/2019 PCP: Parke Poisson, MD    Brief Narrative:  74 y.o. female with medical history significant of arthritis, hypertension and hypothyroidism presented to ED for evaluation of severe pain in right ankle area.  Patient states that the pain started after she slipped while coming out of the vehicle and she is unable to bear the weight on that side.  Patient states that pain had gotten worse to 10 out of 10 on pain scale, getting worse with movement or putting pressure on the leg and gets better with laying on the bed.  Patient denies any weakness, numbness or tingling sensation in her right leg.  Patient also denies fever, chills, headache, dizziness, chest pain, shortness of breath, nausea, vomiting, abdominal pain and urinary symptoms.  ED Course: On arrival to the ED patient had blood pressure of 189/92, heart rate 76, temperature 98.3, respiratory rate 14 and oxygen saturation 100% on room air.  Blood work showed mild hypokalemia.  X-ray of right lower extremity showed fracture of distal tibia/fibula.  Patient was given Dilaudid in the ED.  ED physician contacted Dr. Ames Coupe who will perform orif tomorrow.   Assessment & Plan:   Principal Problem:   Fracture of distal end of tibia with fibula Active Problems:   Essential hypertension   #1 right tibia and fibula fracture status post repair-patient still with moderate to severe painThough she is anxious and wants to go home. Will continue pain control PT has already seen a recommended home health PT PT supposed to come back and work with her again today to see her safety of going home tomorrow. Patient has history of osteoporosis but she tells me that she was on medications for it which made her very sick and she stopped it.  #2 essential hypertension her blood pressure is 121/59 on Norvasc and Lasix.  I will hold Lasix.  #3 hypothyroidism  continue Synthroid     Estimated body mass index is 20.51 kg/m as calculated from the following:   Height as of this encounter: 5' (1.524 m).   Weight as of this encounter: 47.6 kg.  DVT prophylaxis: Lovenox Code Status: Full code Family Communication: None  disposition Plan: Likely home tomorrow with home health if she does well with therapy   Consultants:   Ortho  Procedures: Right tibia-fibula fracture ORIF Antimicrobials: None  Subjective: Patient is resting in bed Anxious to go home Complaining of moderate to severe pain  Objective: Vitals:   10/26/19 2139 10/27/19 0140 10/27/19 0531 10/27/19 1437  BP: (!) 124/57 137/70 134/73 (!) 121/59  Pulse: 78 72 86 88  Resp: 16 17 18 16   Temp: 98.4 F (36.9 C) 98.8 F (37.1 C) 98.6 F (37 C) 98.1 F (36.7 C)  TempSrc: Oral Oral Oral Oral  SpO2: 99% 100% 100% 98%  Weight:      Height:        Intake/Output Summary (Last 24 hours) at 10/27/2019 1508 Last data filed at 10/27/2019 1437 Gross per 24 hour  Intake 520 ml  Output 450 ml  Net 70 ml   Filed Weights   10/25/19 1836  Weight: 47.6 kg    Examination:  General exam: Appears calm and comfortable  Respiratory system: Clear to auscultation. Respiratory effort normal. Cardiovascular system: S1 & S2 heard, RRR. No JVD, murmurs, rubs, gallops or clicks. No pedal edema. Gastrointestinal system: Abdomen is nondistended, soft and nontender. No organomegaly or masses  felt. Normal bowel sounds heard. Central nervous system: Alert and oriented. No focal neurological deficits. Extremities: Right lower extremity covered with dressings Skin: No rashes, lesions or ulcers Psychiatry: Judgement and insight appear normal. Mood & affect appropriate.     Data Reviewed: I have personally reviewed following labs and imaging studies  CBC: Recent Labs  Lab 10/25/19 2031 10/26/19 0912  WBC 8.0 5.5  NEUTROABS 6.9  --   HGB 12.7 11.6*  HCT 38.4 35.8*  MCV 91.0 92.7  PLT  252 [id number]   Basic Metabolic Panel: Recent Labs  Lab 10/25/19 2031 10/26/19 0912  NA 141 140  K 3.3* 3.5  CL 110 108  CO2 22 24  GLUCOSE 104* 97  BUN 15 13  CREATININE 0.68 0.59  CALCIUM 9.5 9.2   GFR: Estimated Creatinine Clearance: 43.6 mL/min (by C-G formula based on SCr of 0.59 mg/dL). Liver Function Tests: Recent Labs  Lab 10/26/19 0912  AST 13*  ALT 12  ALKPHOS 52  BILITOT 1.0  PROT 5.8*  ALBUMIN 3.8   No results for input(s): LIPASE, AMYLASE in the last 168 hours. No results for input(s): AMMONIA in the last 168 hours. Coagulation Profile: Recent Labs  Lab 10/26/19 0912  INR 0.9   Cardiac Enzymes: No results for input(s): CKTOTAL, CKMB, CKMBINDEX, TROPONINI in the last 168 hours. BNP (last 3 results) No results for input(s): PROBNP in the last 8760 hours. HbA1C: No results for input(s): HGBA1C in the last 72 hours. CBG: No results for input(s): GLUCAP in the last 168 hours. Lipid Profile: No results for input(s): CHOL, HDL, LDLCALC, TRIG, CHOLHDL, LDLDIRECT in the last 72 hours. Thyroid Function Tests: No results for input(s): TSH, T4TOTAL, FREET4, T3FREE, THYROIDAB in the last 72 hours. Anemia Panel: No results for input(s): VITAMINB12, FOLATE, FERRITIN, TIBC, IRON, RETICCTPCT in the last 72 hours. Sepsis Labs: No results for input(s): PROCALCITON, LATICACIDVEN in the last 168 hours.  Recent Results (from the past 240 hour(s))  Respiratory Panel by RT PCR (Flu A&B, Covid) - Nasopharyngeal Swab     Status: None   Collection Time: 10/25/19  8:31 PM   Specimen: Nasopharyngeal Swab  Result Value Ref Range Status   SARS Coronavirus 2 by RT PCR NEGATIVE NEGATIVE Final    Comment: (NOTE) SARS-CoV-2 target nucleic acids are NOT DETECTED. The SARS-CoV-2 RNA is generally detectable in upper respiratoy specimens during the acute phase of infection. The lowest concentration of SARS-CoV-2 viral copies this assay can detect is 131 copies/mL. A negative result  does not preclude SARS-Cov-2 infection and should not be used as the sole basis for treatment or other patient management decisions. A negative result may occur with  improper specimen collection/handling, submission of specimen other than nasopharyngeal swab, presence of viral mutation(s) within the areas targeted by this assay, and inadequate number of viral copies (<131 copies/mL). A negative result must be combined with clinical observations, patient history, and epidemiological information. The expected result is Negative. Fact Sheet for Patients:  PinkCheek.be Fact Sheet for Healthcare Providers:  GravelBags.it This test is not yet ap proved or cleared by the [place] FDA and  has been authorized for detection and/or diagnosis of SARS-CoV-2 by FDA under an Emergency Use Authorization (EUA). This EUA will remain  in effect (meaning this test can be used) for the duration of the COVID-19 declaration under Section 564(b)(1) of the Act, 21 U.S.C. section 360bbb-3(b)(1), unless the authorization is terminated or revoked sooner.    Influenza A by PCR NEGATIVE  NEGATIVE Final   Influenza B by PCR NEGATIVE NEGATIVE Final    Comment: (NOTE) The Xpert Xpress SARS-CoV-2/FLU/RSV assay is intended as an aid in  the diagnosis of influenza from Nasopharyngeal swab specimens and  should not be used as a sole basis for treatment. Nasal washings and  aspirates are unacceptable for Xpert Xpress SARS-CoV-2/FLU/RSV  testing. Fact Sheet for Patients: PinkCheek.be Fact Sheet for Healthcare Providers: GravelBags.it This test is not yet approved or cleared by the [place] FDA and  has been authorized for detection and/or diagnosis of SARS-CoV-2 by  FDA under an Emergency Use Authorization (EUA). This EUA will remain  in effect (meaning this test can be used) for the duration of  the  Covid-19 declaration under Section 564(b)(1) of the Act, 21  U.S.C. section 360bbb-3(b)(1), unless the authorization is  terminated or revoked. Performed at [facility], [place] [address] [place], [place] [address]   MRSA PCR Screening     Status: None   Collection Time: 10/26/19  9:47 AM   Specimen: Nasal Mucosa; Nasopharyngeal  Result Value Ref Range Status   MRSA by PCR NEGATIVE NEGATIVE Final    Comment:        The GeneXpert MRSA Assay (FDA approved for NASAL specimens only), is one component of a comprehensive MRSA colonization surveillance program. It is not intended to diagnose MRSA infection nor to guide or monitor treatment for MRSA infections. Performed at [facility], [place] [address], [place], [place] [address]          Radiology Studies: DG Chest 1 View  Result Date: 10/25/2019 CLINICAL DATA:  Preoperative radiograph. EXAM: CHEST  1 VIEW COMPARISON:  December 23, 2012 FINDINGS: Cardiomediastinal silhouette is normal. Mediastinal contours appear intact. 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 There is no evidence of focal airspace consolidation, pleural effusion or pneumothorax. Osseous structures are without acute abnormality. Soft tissues are grossly normal. IMPRESSION: 1. 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 Images from patient's prior chest CT angiogram dated March 14, 2001 are not available for comparison. According to the report, the distal thoracic aneurysm measured 3.9 cm in maximum diameter. Further evaluation with CT angiogram of the chest may be considered for proper evaluation. 2. No evidence of acute cardiopulmonary disease. Electronically Signed   By: Fidela Salisbury M.D.   On: 10/25/2019 20:57   DG  Tibia/Fibula Right  Result Date: 10/26/2019 CLINICAL DATA:  Right tibial nail EXAM: RIGHT TIBIA AND FIBULA - 2 VIEW; DG C-ARM 1-60 MIN-NO REPORT COMPARISON:  Ankle radiograph 10/25/2019 FINDINGS: Fluoroscopic images depict interval placement of a right tibial intramedullary nail secured proximally and distally with fully threaded screws within the proximal and distal metadiaphysis. No acute hardware complication. There is improved alignment of the distal tibial spiral fracture. Additional proximal and distal fibular fractures are seen as well. IMPRESSION: Status post intramedullary nail fixation of the right tibia. No acute hardware complication. Proximal and distal fibular fractures are present. Electronically Signed   By: Lovena Le M.D.   On: 10/26/2019 15:46   DG Ankle Complete Right  Result Date: 10/25/2019 CLINICAL DATA:  74 year old female with fall and right lower extremity pain. EXAM: RIGHT ANKLE - COMPLETE 3+ VIEW COMPARISON:  None. FINDINGS: There is a mildly displaced spiral fracture of the distal tibial diaphysis with approximately 5 mm lateral displacement  of the distal fracture fragment. Minimally displaced oblique fracture of the distal fibular diaphysis. No dislocation. The bones are osteopenic. Mild diffuse subcutaneous edema. IMPRESSION: 1. Fractures of the distal tibia and fibula. 2. Osteopenia. Electronically Signed   By: Anner Crete M.D.   On: 10/25/2019 19:31   DG C-Arm 1-60 Min-No Report  Result Date: 10/26/2019 CLINICAL DATA:  Right tibial nail EXAM: RIGHT TIBIA AND FIBULA - 2 VIEW; DG C-ARM 1-60 MIN-NO REPORT COMPARISON:  Ankle radiograph 10/25/2019 FINDINGS: Fluoroscopic images depict interval placement of a right tibial intramedullary nail secured proximally and distally with fully threaded screws within the proximal and distal metadiaphysis. No acute hardware complication. There is improved alignment of the distal tibial spiral fracture. Additional proximal and distal  fibular fractures are seen as well. IMPRESSION: Status post intramedullary nail fixation of the right tibia. No acute hardware complication. Proximal and distal fibular fractures are present. Electronically Signed   By: Lovena Le M.D.   On: 10/26/2019 15:46        Scheduled Meds: . amLODipine  2.5 mg Oral QPC breakfast  . cholecalciferol  3,000 Units Oral Daily  . docusate sodium  100 mg Oral BID  . enoxaparin (LOVENOX) injection  40 mg Subcutaneous Q24H  . furosemide  20 mg Oral QPC breakfast  . levothyroxine  88 mcg Oral Q0600  . potassium chloride  10 mEq Oral Daily  . pramipexole  1.5 mg Oral QHS   Continuous Infusions: . sodium chloride 75 mL/hr at 10/26/19 1702     LOS: 2 days     Georgette Shell, MD [facility]  If 7PM-7AM, please contact night-coverage [email or web address] Password [facility] 10/27/2019, 3:08 PM

## 2019-10-27 NOTE — Evaluation (Signed)
Physical Therapy Evaluation Patient Details Name: Donna Alvarez MRN: [id number] DOB: 1946/05/24 Today's Date: 10/27/2019   History of Present Illness  74 year old female community ambulator with history of hypertension and also hypothyroidism slipped and fell getting out of the vehicle suffered a right tib-fib fracture, shaft, closed and displaced. s/p right IM nail.   Clinical Impression  Pt admitted with above diagnosis.  Pt currently with functional limitations due to the deficits listed below (see PT Problem List). Pt will benefit from skilled PT to increase their independence and safety with mobility to allow discharge to the venue listed below.  Pt ambulating 30' with RW and good understanding of PWB status with boot.  Increased pain with ambulation. Decreased safety/balance with ADLs in the bathroom. Could use another session tomorrow AM to increase ambulation distance and address stairs. Recommend HHPT and 3-1 BSC.     Follow Up Recommendations Home health PT;Supervision/Assistance - 24 hour    Equipment Recommendations  3in1 (PT)    Recommendations for Other Services       Precautions / Restrictions Precautions Precautions: Fall Required Braces or Orthoses: Other Brace(CAM boot) Other Brace: CAM walker boot Restrictions RLE Weight Bearing: Partial weight bearing RLE Partial Weight Bearing Percentage or Pounds: 50% Other Position/Activity Restrictions: CAM walker boot      Mobility  Bed Mobility Overal bed mobility: Needs Assistance Bed Mobility: Supine to Sit     Supine to sit: Min assist     General bed mobility comments: MIN A for R LE. Sheets soaked in urine and pt requesting to go to the bathroom to get cleaned up.  Transfers Overall transfer level: Needs assistance Equipment used: Rolling walker (2 wheeled) Transfers: Sit to/from Stand Sit to Stand: Min assist         General transfer comment: MIN A to power up and for stability. Stood from bed  and toilet.  Ambulation/Gait Ambulation/Gait assistance: Min guard Gait Distance (Feet): 30 Feet(10 + 20) Assistive device: Rolling walker (2 wheeled) Gait Pattern/deviations: Decreased step length - right;Decreased step length - left;Decreased stance time - right     General Gait Details: Pt able to ambulate with very small steps to the bathroom and then to recliner. Cues to stay within base of RW, but was able to maintain 50% WB on R LE. Pt could have walked a little farther, but her breakfast try had arrived and nurse came with pain meds.  Stairs            Wheelchair Mobility    Modified Rankin (Stroke Patients Only)       Balance Overall balance assessment: Needs assistance   Sitting balance-Leahy Scale: Fair       Standing balance-Leahy Scale: Poor Standing balance comment: Pt attempting to clean self in standing without UE support, but unable to safely and needed cues for safety to keep one hand on RW.                             Pertinent Vitals/Pain Pain Assessment: 0-10 Pain Score: 7  Pain Location: RLE with gait Pain Descriptors / Indicators: Sore Pain Intervention(s): Limited activity within patient's tolerance;Monitored during session;Patient requesting pain meds-RN notified    Home Living Family/patient expects to be discharged to:: Private residence Living Arrangements: Spouse/significant other;Children Available Help at Discharge: Family;Available 24 hours/day Type of Home: House Home Access: Stairs to enter Entrance Stairs-Rails: None Entrance Stairs-Number of Steps: 3 Home Layout: Able to  live on main level with bedroom/bathroom;Two level Home Equipment: Walker - 2 wheels;Cane - single point;Crutches Additional Comments: pt reports she plans to d/c to her daughter's house and she is available 24/7, pts husband will be there as well    Prior Function Level of Independence: Independent         Comments: pt was driving, grocery  shopping, totally independent     Hand Dominance   Dominant Hand: Right    Extremity/Trunk Assessment   Upper Extremity Assessment Upper Extremity Assessment: Overall WFL for tasks assessed    Lower Extremity Assessment Lower Extremity Assessment: RLE deficits/detail RLE Deficits / Details: PWB 50% with CAM walker boot       Communication   Communication: No difficulties  Cognition Arousal/Alertness: Awake/alert Behavior During Therapy: WFL for tasks assessed/performed Overall Cognitive Status: Within Functional Limits for tasks assessed                                        General Comments      Exercises     Assessment/Plan    PT Assessment Patient needs continued PT services  PT Problem List Decreased strength;Decreased activity tolerance;Decreased balance;Decreased mobility;Decreased knowledge of use of DME;Decreased safety awareness       PT Treatment Interventions DME instruction;Gait training;Stair training;Functional mobility training;Therapeutic activities;Therapeutic exercise;Balance training;Neuromuscular re-education;Patient/family education    PT Goals (Current goals can be found in the Care Plan section)  Acute Rehab PT Goals Patient Stated Goal: to get stronger and go home PT Goal Formulation: With patient Time For Goal Achievement: 11/10/19 Potential to Achieve Goals: Good    Frequency Min 3X/week   Barriers to discharge Inaccessible home environment 3 steps to enter daughter's home    Co-evaluation               AM-PAC PT "6 Clicks" Mobility  Outcome Measure Help needed turning from your back to your side while in a flat bed without using bedrails?: A Little Help needed moving from lying on your back to sitting on the side of a flat bed without using bedrails?: A Little Help needed moving to and from a bed to a chair (including a wheelchair)?: A Little Help needed standing up from a chair using your arms (e.g.,  wheelchair or bedside chair)?: A Little Help needed to walk in hospital room?: A Little Help needed climbing 3-5 steps with a railing? : A Lot 6 Click Score: 17    End of Session Equipment Utilized During Treatment: Gait belt Activity Tolerance: Patient limited by pain;Patient tolerated treatment well Patient left: in chair;with call bell/phone within reach;with chair alarm set Nurse Communication: Mobility status;Patient requests pain meds PT Visit Diagnosis: Unsteadiness on feet (R26.81);History of falling (Z91.81);Difficulty in walking, not elsewhere classified (R26.2)    Time: 0916(no charge for time on toilet)-0956 PT Time Calculation (min) (ACUTE ONLY): 40 min   Charges:   PT Evaluation $PT Eval Moderate Complexity: 1 Mod PT Treatments $Gait Training: 8-22 mins        Sundai Probert L. Tamala Julian, [place] Pager [phone] 10/27/2019   Galen Manila 10/27/2019, 11:38 AM

## 2019-10-27 NOTE — Discharge Instructions (Signed)
Ok to shower , your dressing will be water proof.  50% weight on your leg. See Dr. Lorin Mercy in two weeks. Elevate foot to decrease pain and swelling. Take one bayer aspirin daily for 4 wks to decrease risks of blood clots.

## 2019-10-27 NOTE — Progress Notes (Signed)
   Subjective: 1 Day Post-Op Procedure(s) (LRB): INTRAMEDULLARY (IM) NAIL TIBIAL (Right) Patient reports pain as moderate."  I am ready to go home today, I went home same day after knee replacement"  Objective: Vital signs in last 24 hours: Temp:  [97.9 F (36.6 C)-98.9 F (37.2 C)] 98.6 F (37 C) (01/18 0531) Pulse Rate:  [69-87] 86 (01/18 0531) Resp:  [10-27] 18 (01/18 0531) BP: (124-162)/(57-85) 134/73 (01/18 0531) SpO2:  [99 %-100 %] 100 % (01/18 0531)  Intake/Output from previous day: 01/17 0701 - 01/18 0700 In: 1340 [P.O.:240; I.V.:1000; IV Piggyback:100] Out: 490 [Urine:450; Blood:40] Intake/Output this shift: No intake/output data recorded.  Recent Labs    10/25/19 2031 10/26/19 0912  HGB 12.7 11.6*   Recent Labs    10/25/19 2031 10/26/19 0912  WBC 8.0 5.5  RBC 4.22 3.86*  HCT 38.4 35.8*  PLT 252 226   Recent Labs    10/25/19 2031 10/26/19 0912  NA 141 140  K 3.3* 3.5  CL 110 108  CO2 22 24  BUN 15 13  CREATININE 0.68 0.59  GLUCOSE 104* 97  CALCIUM 9.5 9.2   Recent Labs    10/26/19 0912  INR 0.9    Neurologically intact DG Tibia/Fibula Right  Result Date: 10/26/2019 CLINICAL DATA:  Right tibial nail EXAM: RIGHT TIBIA AND FIBULA - 2 VIEW; DG C-ARM 1-60 MIN-NO REPORT COMPARISON:  Ankle radiograph 10/25/2019 FINDINGS: Fluoroscopic images depict interval placement of a right tibial intramedullary nail secured proximally and distally with fully threaded screws within the proximal and distal metadiaphysis. No acute hardware complication. There is improved alignment of the distal tibial spiral fracture. Additional proximal and distal fibular fractures are seen as well. IMPRESSION: Status post intramedullary nail fixation of the right tibia. No acute hardware complication. Proximal and distal fibular fractures are present. Electronically Signed   By: Lovena Le M.D.   On: 10/26/2019 15:46   DG C-Arm 1-60 Min-No Report  Result Date:  10/26/2019 Fluoroscopy was utilized by the requesting physician.  No radiographic interpretation.    Assessment/Plan: 1 Day Post-Op Procedure(s) (LRB): INTRAMEDULLARY (IM) NAIL TIBIAL (Right) Up with therapy. Will send Rx in for pain, muscle relaxant and aspirin for DVT prophylaxis times 4 wks.  If she does well with therapy will need dressing change and mepilex before discharge.   [name] 10/27/2019, 9:20 AM

## 2019-10-28 DIAGNOSIS — S82201D Unspecified fracture of shaft of right tibia, subsequent encounter for closed fracture with routine healing: Secondary | ICD-10-CM

## 2019-10-28 DIAGNOSIS — S82401D Unspecified fracture of shaft of right fibula, subsequent encounter for closed fracture with routine healing: Secondary | ICD-10-CM

## 2019-10-28 NOTE — Plan of Care (Signed)
Patient discharged home in stable condition. Waiting on her ride 

## 2019-10-28 NOTE — TOC Transition Note (Addendum)
Transition of Care [facility]) - CM/SW Discharge Note   Patient Details  Name: Donna Alvarez MRN: [id number] Date of Birth: 01/20/1946  Transition of Care [facility]) CM/SW Contact:  Lia Hopping, [place] Phone Number: 10/28/2019, 1:20 PM   Clinical Narrative:    CSW met with the patient at bedside to discuss Home Health options. Patient reports she had physical therapy come out to her home post knee surgery last year. She used [facility]. CSW reached out to [facility] for to determine availability. The agency cannot see the patient until next Monday. Patient agreeable for CSW contact other agencies for a sooner date.  [facility]-No staffing [facility]- Out of Network [facility] number out of network [facility]-Unable to accept.   [facility] can accept, has available PT staff starting Friday. CSW notified the physician and confirm starting therapy Friday should be fine.  Information written on AVS.  Patient discharge address confirm [address] [place], [place]  Patient has RW and declines a 3 IN 1.     Final next level of care: [place] Barriers to Discharge: No Barriers Identified   Patient Goals and CMS Choice Patient states their goals for this hospitalization and ongoing recovery are:: "fracture to heal" CMS Medicare.gov Compare Post Acute Care list provided to:: Patient Choice offered to / list presented to : Patient  Discharge Placement                       Discharge Plan and Services                DME Arranged: (Has DME-RW and declines a 3 in 1)         HH Arranged: PT HH Agency: [facility] Date [place]: 10/28/19 Time [place]: 1320 Representative spoke with at [place]: [place]  Social Determinants of Health ([place]) Interventions     Readmission Risk Interventions No flowsheet data found.

## 2019-10-28 NOTE — Progress Notes (Signed)
Occupational Therapy Treatment Patient Details Name: Donna Alvarez MRN: [id number] DOB: 09-29-1946 Today's Date: 10/28/2019    History of present illness 74 year old female community ambulator with history of hypertension and also hypothyroidism slipped and fell getting out of the vehicle suffered a right tib-fib fracture, shaft, closed and displaced. s/p right IM nail.    OT comments  Pt progressing towards acute OT goals. Focus of session was LB dressing technique, toilet transfer, and 1 grooming task standing at sink. D/c plan remains appropriate.   Follow Up Recommendations  Home health OT;Supervision - Intermittent    Equipment Recommendations  3 in 1 bedside commode    Recommendations for Other Services      Precautions / Restrictions Precautions Precautions: Fall Required Braces or Orthoses: Other Brace Other Brace: CAM walker boot Restrictions Weight Bearing Restrictions: Yes RLE Weight Bearing: Partial weight bearing RLE Partial Weight Bearing Percentage or Pounds: 50 Other Position/Activity Restrictions: CAM walker boot       Mobility Bed Mobility Overal bed mobility: Modified Independent             General bed mobility comments: up in chair at start of session  Transfers Overall transfer level: Needs assistance Equipment used: Rolling walker (2 wheeled) Transfers: Sit to/from [facility] Sit to Stand: Supervision;Min guard         General transfer comment: for safety    Balance Overall balance assessment: Needs assistance Sitting-balance support: No upper extremity supported;Feet supported Sitting balance-Leahy Scale: Fair     Standing balance support: Bilateral upper extremity supported;Single extremity supported;During functional activity Standing balance-Leahy Scale: Poor                             ADL either performed or assessed with clinical judgement   ADL Overall ADL's : Needs  assistance/impaired     Grooming: Min guard;Standing                   Toilet Transfer: Min guard;Ambulation;RW   Toileting- Water quality scientist and Hygiene: Min guard;Set up;Sitting/lateral lean;Sit to/from stand       Functional mobility during ADLs: Min guard;Rolling walker General ADL Comments: Pt completed bathroom ambulation, toilet transfer, pericare, and grooming task standing at sink. Discussed LB dressing technique and shower transfer.      Vision       Perception     Praxis      Cognition Arousal/Alertness: Awake/alert Behavior During Therapy: WFL for tasks assessed/performed Overall Cognitive Status: Within Functional Limits for tasks assessed                                 General Comments: pt demonstrates good awareness of PWB precautions and highly motivated        Exercises     Shoulder Instructions       General Comments      Pertinent Vitals/ Pain       Pain Assessment: 0-10 Pain Score: 4  Pain Location: RLE with gait Pain Descriptors / Indicators: Discomfort Pain Intervention(s): Limited activity within patient's tolerance;Monitored during session;Patient requesting pain meds-RN notified;Repositioned  Home Living                                          Prior Functioning/Environment  Frequency  Min 2X/week        Progress Toward Goals  OT Goals(current goals can now be found in the care plan section)  Progress towards OT goals: Progressing toward goals  Acute Rehab OT Goals Patient Stated Goal: to get stronger and go home OT Goal Formulation: With patient Time For Goal Achievement: 11/09/19 Potential to Achieve Goals: Good ADL Goals Pt Will Perform Grooming: with supervision;standing;sitting Pt Will Perform Lower Body Dressing: with supervision;sit to/from stand Pt Will Transfer to Toilet: with supervision;ambulating Pt Will Perform Tub/Shower Transfer: with  supervision  Plan Discharge plan remains appropriate    Co-evaluation                 AM-PAC OT "6 Clicks" Daily Activity     Outcome Measure   Help from another person eating meals?: None Help from another person taking care of personal grooming?: None Help from another person toileting, which includes using toliet, bedpan, or urinal?: A Little Help from another person bathing (including washing, rinsing, drying)?: A Little Help from another person to put on and taking off regular upper body clothing?: None Help from another person to put on and taking off regular lower body clothing?: A Little 6 Click Score: 21    End of Session Equipment Utilized During Treatment: Rolling walker;Other (comment)(CAM boot)  OT Visit Diagnosis: Other abnormalities of gait and mobility (R26.89);Pain;History of falling (Z91.81) Pain - Right/Left: Right Pain - part of body: Leg   Activity Tolerance Patient tolerated treatment well   Patient Left in chair;with call bell/phone within reach   Nurse Communication Patient requests pain meds        Time: 1222-1240 OT Time Calculation (min): 18 min  Charges: OT General Charges $OT Visit: 1 Visit OT Treatments $Self Care/Home Management : 8-22 mins  Tyrone Schimke, OT Acute Rehabilitation Services Pager: [phone] Office: [phone]   Hortencia Pilar 10/28/2019, 12:49 PM

## 2019-10-28 NOTE — Progress Notes (Signed)
Physical Therapy Treatment Patient Details Name: Donna Alvarez MRN: [id number] DOB: 05-26-1946 Today's Date: 10/28/2019    History of Present Illness 74 year old female community ambulator with history of hypertension and also hypothyroidism slipped and fell getting out of the vehicle suffered a right tib-fib fracture, shaft, closed and displaced. s/p right IM nail.     PT Comments    POD # 2 Pt in bed with R LE elevated.  Aware she is PWB and Highly motivated and knowledgeable having recovered from TKR.  Assisted OOB only required increased time.  Amb a functional distance then returned to room to perform some TE's.  Knee presses, HS, ABD/ADd, SLR.  Instructed on use of walker as long as she is PWB.  Instructed on activity level and use of ICE.  Addressed all mobility questions, discussed appropriate activity, educated on use of ICE.  Pt ready for D/C to home.   Follow Up Recommendations  Home health PT;Supervision/Assistance - 24 hour     Equipment Recommendations  None recommended by PT(pt declines a 3:1 "I can get to the bathroom")    Recommendations for Other Services       Precautions / Restrictions Precautions Precautions: Fall Required Braces or Orthoses: Other Brace Other Brace: CAM walker boot Restrictions Weight Bearing Restrictions: Yes RLE Weight Bearing: Partial weight bearing RLE Partial Weight Bearing Percentage or Pounds: 50% Other Position/Activity Restrictions: elevate R LE and CAM Boot all times    Mobility  Bed Mobility Overal bed mobility: Modified Independent             General bed mobility comments: only required increased time  Transfers Overall transfer level: Needs assistance Equipment used: Rolling walker (2 wheeled) Transfers: Sit to/from [facility] Sit to Stand: Supervision;Min guard Stand pivot transfers: Supervision;Min guard       General transfer comment: only required increased  time  Ambulation/Gait Ambulation/Gait assistance: Supervision;Min guard Gait Distance (Feet): 42 Feet Assistive device: Rolling walker (2 wheeled) Gait Pattern/deviations: Decreased step length - right;Decreased step length - left;Decreased stance time - right Gait velocity: decreased   General Gait Details: tolerated a functional distance   Stairs Stairs: (pt going to daughters house and stated "they are going to carry me up the stairs into house")           Wheelchair Mobility    Modified Rankin (Stroke Patients Only)       Balance                                            Cognition Arousal/Alertness: Awake/alert Behavior During Therapy: WFL for tasks assessed/performed Overall Cognitive Status: Within Functional Limits for tasks assessed                                 General Comments: pt demonstrates good awareness of PWB precautions and highly motivated      Exercises  10 reps knee presses, HS, ABd/ADd, SLR in semi recline position    General Comments        Pertinent Vitals/Pain Pain Assessment: 0-10 Pain Score: 3  Pain Location: RLE with gait Pain Descriptors / Indicators: Discomfort Pain Intervention(s): Monitored during session;Repositioned;Premedicated before session;Ice applied    Home Living  Prior Function            PT Goals (current goals can now be found in the care plan section) Progress towards PT goals: Progressing toward goals    Frequency    Min 3X/week      PT Plan Current plan remains appropriate    Co-evaluation              AM-PAC PT "6 Clicks" Mobility   Outcome Measure  Help needed turning from your back to your side while in a flat bed without using bedrails?: None Help needed moving from lying on your back to sitting on the side of a flat bed without using bedrails?: None Help needed moving to and from a bed to a chair (including a  wheelchair)?: A Little Help needed standing up from a chair using your arms (e.g., wheelchair or bedside chair)?: A Little Help needed to walk in hospital room?: A Little Help needed climbing 3-5 steps with a railing? : A Lot 6 Click Score: 19    End of Session Equipment Utilized During Treatment: Gait belt Activity Tolerance: Patient limited by pain;Patient tolerated treatment well Patient left: in chair;with call bell/phone within reach;with chair alarm set Nurse Communication: (pt has met therapy goals and ready to D/C to home) PT Visit Diagnosis: Unsteadiness on feet (R26.81);History of falling (Z91.81);Difficulty in walking, not elsewhere classified (R26.2)     Time: 1100-1130 PT Time Calculation (min) (ACUTE ONLY): 30 min  Charges:  $Gait Training: 8-22 mins $Therapeutic Exercise: 8-22 mins                     Rica Koyanagi  PTA Acute  Rehabilitation Services Pager      [phone] Office      [phone]

## 2019-10-28 NOTE — Care Management Important Message (Signed)
Important Message  Patient Details IM Letter given to [place] Case Manager to present to the Patient Name: Donna Alvarez MRN: [id number] Date of Birth: 04-17-1946   Medicare Important Message Given:  Yes     Kerin Salen 10/28/2019, 2:19 PM

## 2019-10-28 NOTE — Progress Notes (Signed)
   Subjective: 2 Days Post-Op Procedure(s) (LRB): INTRAMEDULLARY (IM) NAIL TIBIAL (Right) Patient reports pain as mild and moderate.    Objective: Vital signs in last 24 hours: Temp:  [98.1 F (36.7 C)-100.4 F (38 C)] 99 F (37.2 C) (01/19 0511) Pulse Rate:  [83-96] 96 (01/19 0511) Resp:  [12-18] 16 (01/19 0511) BP: (121-145)/(57-75) 145/75 (01/19 0511) SpO2:  [92 %-98 %] 95 % (01/19 0511)  Intake/Output from previous day: 01/18 0701 - 01/19 0700 In: 1260 [P.O.:1260] Out: -  Intake/Output this shift: No intake/output data recorded.  Recent Labs    10/25/19 2031 10/26/19 0912  HGB 12.7 11.6*   Recent Labs    10/25/19 2031 10/26/19 0912  WBC 8.0 5.5  RBC 4.22 3.86*  HCT 38.4 35.8*  PLT 252 226   Recent Labs    10/25/19 2031 10/26/19 0912  NA 141 140  K 3.3* 3.5  CL 110 108  CO2 22 24  BUN 15 13  CREATININE 0.68 0.59  GLUCOSE 104* 97  CALCIUM 9.5 9.2   Recent Labs    10/26/19 0912  INR 0.9    Neurologically intact     Dressing changed. She has a few small fracture blisters.  No results found.  Assessment/Plan: 2 Days Post-Op Procedure(s) (LRB): INTRAMEDULLARY (IM) NAIL TIBIAL (Right) Up with therapy. She is getting OOB to BR on her own. OK for discharge today if medically stable. Rx sent in , ASA for DVT prophylaxis. Office two weeks.  Needs foot elevated at home with swelling and fracture blisters.   Donna Alvarez 10/28/2019, 7:43 AM

## 2019-10-28 NOTE — Discharge Summary (Signed)
Physician Discharge Summary  TARNA YORE [id number] DOB: [birth year] DOA: 10/25/2019  PCP: Parke Poisson, MD  Admit date: 10/25/2019 Discharge date: 10/28/2019  Admitted From: home Disposition: home Recommendations for Outpatient Follow-up:  1. Follow up with PCP in 1-2 weeks 2. Please obtain BMP/CBC in one week 3. Please follow up with Dr  Lorin Mercy  Home Health:yes Equipment/Devices none  Discharge Condition:stable and improved  CODE STATUS:full Diet recommendation: cardiac Brief/Interim Summary:73 y.o.femalewith medical history significant ofarthritis, hypertension and hypothyroidism presented to ED for evaluation of severe pain in right ankle area. Patient states that the pain started after she slipped while coming out of the vehicle and she is unable to bear the weight on that side. Patient states that pain had gotten worse to 10 out of 10 on pain scale, getting worse with movement or putting pressure on the leg and gets better with laying on the bed. Patient denies any weakness, numbness or tingling sensation in her right leg. Patient also denies fever, chills, headache, dizziness, chest pain, shortness of breath, nausea, vomiting, abdominal pain and urinary symptoms. ED Course:On arrival to the ED patient had blood pressure of 189/92, heart rate 76, temperature 98.3, respiratory rate 14 and oxygen saturation 100% on room air. Blood work showed mild hypokalemia. X-ray of right lower extremity showed fracture of distal tibia/fibula. Patient was given Dilaudid in the ED. ED physician contacted Dr. Benjamin Stain will perform oriftomorrow.    Discharge Diagnoses:  Principal Problem:   Closed fracture of right fibula and tibia Active Problems:   Essential hypertension   #1 right tibia and fibula fracture status post repair-patient worked with physical therapy.  She will be discharged home today with home health PT.  Patient needs home health PT for  gait training transfer training and stair training, she cannot leave home without assistance and she has unsteady gait due to recent fracture. Patient has history of osteoporosis but she tells me that she was on medications for it which made her very sick and she stopped it. ASA for DVT prophylaxis per ortho  #2 essential hypertension her blood pressure is 145/75.  Continue Norvasc and Lasix.   #3 hypothyroidism continue Synthroid   Estimated body mass index is 20.51 kg/m as calculated from the following:   Height as of this encounter: 5' (1.524 m).   Weight as of this encounter: 47.6 kg.  Discharge Instructions  Discharge Instructions    Call MD for:  persistant dizziness or light-headedness   Complete by: As directed    Call MD for:  persistant nausea and vomiting   Complete by: As directed    Call MD for:  severe uncontrolled pain   Complete by: As directed    Call MD for:  temperature >100.4   Complete by: As directed    Diet - low sodium heart healthy   Complete by: As directed    Increase activity slowly   Complete by: As directed      Allergies as of 10/28/2019      Reactions   Cephalexin Hives   Doxycycline Rash   Blisters to toes and hands Blisters to toes and hands   Risedronate Other (See Comments)   Bone pain Bone pain   Sulfamethoxazole-trimethoprim Other (See Comments)      Medication List    STOP taking these medications   clindamycin 300 MG capsule Commonly known as: CLEOCIN   diazepam 5 MG tablet Commonly known as: Valium   ibuprofen 200 MG tablet Commonly known  as: ADVIL   oxyCODONE-acetaminophen 10-325 MG tablet Commonly known as: Percocet Replaced by: oxyCODONE-acetaminophen 5-325 MG tablet     TAKE these medications   amLODipine 2.5 MG tablet Commonly known as: NORVASC Take 2.5 mg by mouth daily after breakfast.   aspirin 325 MG tablet Commonly known as: Bayer Aspirin Take 1 tablet (325 mg total) by mouth daily. Take one daily  to decrease risks of blood clots for 4 wks then OK to stop   Cholecalciferol 75 MCG (3000 UT) Tabs Take 3,000 Units by mouth daily.   DSS 100 MG Caps Take 100 mg by mouth 2 (two) times daily.   furosemide 20 MG tablet Commonly known as: LASIX Take 20 mg by mouth daily after breakfast.   methocarbamol 500 MG tablet Commonly known as: Robaxin Take 1 tablet (500 mg total) by mouth every 8 (eight) hours as needed for muscle spasms.   oxyCODONE-acetaminophen 5-325 MG tablet Commonly known as: Percocet Take 1-2 tablets by mouth every 6 (six) hours as needed for severe pain. Post op tibial nail Replaces: oxyCODONE-acetaminophen 10-325 MG tablet   potassium chloride 10 MEQ CR capsule Commonly known as: MICRO-K Take 10 mEq by mouth daily.   pramipexole 1.5 MG tablet Commonly known as: MIRAPEX Take 1.5 mg by mouth at bedtime.   Synthroid 88 MCG tablet Generic drug: levothyroxine Take 88 mcg by mouth See admin instructions. take 14mcg daily and 176 mcg on Sundays\\ MUST HAVE NAME BRAND            Durable Medical Equipment  (From admission, onward)         Start     Ordered   10/26/19 1437  DME Walker rolling  Once    Question Answer Comment  Walker: With 5 Inch Wheels   Patient needs a walker to treat with the following condition Closed tibia fracture      01 /17/21 1436         Follow-up Information    [name], MD Follow up in 2 week(s).   Specialty: Orthopedic Surgery Contact information: [place] [address] [phone]        Parke Poisson, MD Follow up.   Specialty: Internal Medicine Contact information: [address] [place] [address] [phone]          Allergies  Allergen Reactions  . Cephalexin Hives  . Doxycycline Rash    Blisters to toes and hands Blisters to toes and hands   . Risedronate Other (See Comments)    Bone pain Bone pain   . Sulfamethoxazole-Trimethoprim Other (See Comments)     Consultations: ortho  Procedures/Studies: DG Chest 1 View  Result Date: 10/25/2019 CLINICAL DATA:  Preoperative radiograph. EXAM: CHEST  1 VIEW COMPARISON:  December 23, 2012 FINDINGS: Cardiomediastinal silhouette is normal. Mediastinal contours appear intact. 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 There is no evidence of focal airspace consolidation, pleural effusion or pneumothorax. Osseous structures are without acute abnormality. Soft tissues are grossly normal. IMPRESSION: 1. 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 Images from patient's prior chest CT angiogram dated March 14, 2001 are not available for comparison. According to the report, the distal thoracic aneurysm measured 3.9 cm in maximum diameter. Further evaluation with CT angiogram of the chest may be considered for proper evaluation. 2. No evidence of acute cardiopulmonary  disease. Electronically Signed   By: Fidela Salisbury M.D.   On: 10/25/2019 20:57   DG Tibia/Fibula Right  Result Date: 10/26/2019 CLINICAL DATA:  Right tibial nail EXAM: RIGHT TIBIA AND FIBULA - 2 VIEW; DG C-ARM 1-60 MIN-NO REPORT COMPARISON:  Ankle radiograph 10/25/2019 FINDINGS: Fluoroscopic images depict interval placement of a right tibial intramedullary nail secured proximally and distally with fully threaded screws within the proximal and distal metadiaphysis. No acute hardware complication. There is improved alignment of the distal tibial spiral fracture. Additional proximal and distal fibular fractures are seen as well. IMPRESSION: Status post intramedullary nail fixation of the right tibia. No acute hardware complication. Proximal and distal fibular fractures are present. Electronically Signed   By: Lovena Le M.D.   On: 10/26/2019 15:46   DG  Ankle Complete Right  Result Date: 10/25/2019 CLINICAL DATA:  74 year old female with fall and right lower extremity pain. EXAM: RIGHT ANKLE - COMPLETE 3+ VIEW COMPARISON:  None. FINDINGS: There is a mildly displaced spiral fracture of the distal tibial diaphysis with approximately 5 mm lateral displacement of the distal fracture fragment. Minimally displaced oblique fracture of the distal fibular diaphysis. No dislocation. The bones are osteopenic. Mild diffuse subcutaneous edema. IMPRESSION: 1. Fractures of the distal tibia and fibula. 2. Osteopenia. Electronically Signed   By: Anner Crete M.D.   On: 10/25/2019 19:31   DG C-Arm 1-60 Min-No Report  Result Date: 10/26/2019 CLINICAL DATA:  Right tibial nail EXAM: RIGHT TIBIA AND FIBULA - 2 VIEW; DG C-ARM 1-60 MIN-NO REPORT COMPARISON:  Ankle radiograph 10/25/2019 FINDINGS: Fluoroscopic images depict interval placement of a right tibial intramedullary nail secured proximally and distally with fully threaded screws within the proximal and distal metadiaphysis. No acute hardware complication. There is improved alignment of the distal tibial spiral fracture. Additional proximal and distal fibular fractures are seen as well. IMPRESSION: Status post intramedullary nail fixation of the right tibia. No acute hardware complication. Proximal and distal fibular fractures are present. Electronically Signed   By: Lovena Le M.D.   On: 10/26/2019 15:46    (Echo, Carotid, EGD, Colonoscopy, ERCP)    Subjective: Awake alert anxious to go home  Discharge Exam: Vitals:   10/28/19 0216 10/28/19 0511  BP:  (!) 145/75  Pulse: 95 96  Resp: 18 16  Temp: 99.9 F (37.7 C) 99 F (37.2 C)  SpO2: 92% 95%   Vitals:   10/27/19 1437 10/27/19 2211 10/28/19 0216 10/28/19 0511  BP: (!) 121/59 (!) 132/57  (!) 145/75  Pulse: 88 83 95 96  Resp: 16 12 18 16   Temp: 98.1 F (36.7 C) (!) 100.4 F (38 C) 99.9 F (37.7 C) 99 F (37.2 C)  TempSrc: Oral Oral Oral Oral   SpO2: 98% 95% 92% 95%  Weight:      Height:        General: Pt is alert, awake, not in acute distress Cardiovascular: RRR, S1/S2 +, no rubs, no gallops Respiratory: CTA bilaterally, no wheezing, no rhonchi Abdominal: Soft, NT, ND, bowel sounds + Extremities RLE covered with cressings  The results of significant diagnostics from this hospitalization (including imaging, microbiology, ancillary and laboratory) are listed below for reference.     Microbiology: Recent Results (from the past 240 hour(s))  Respiratory Panel by RT PCR (Flu A&B, Covid) - Nasopharyngeal Swab     Status: None   Collection Time: 10/25/19  8:31 PM   Specimen: Nasopharyngeal Swab  Result Value Ref Range Status   SARS Coronavirus 2  by RT PCR NEGATIVE NEGATIVE Final    Comment: (NOTE) SARS-CoV-2 target nucleic acids are NOT DETECTED. The SARS-CoV-2 RNA is generally detectable in upper respiratoy specimens during the acute phase of infection. The lowest concentration of SARS-CoV-2 viral copies this assay can detect is 131 copies/mL. A negative result does not preclude SARS-Cov-2 infection and should not be used as the sole basis for treatment or other patient management decisions. A negative result may occur with  improper specimen collection/handling, submission of specimen other than nasopharyngeal swab, presence of viral mutation(s) within the areas targeted by this assay, and inadequate number of viral copies (<131 copies/mL). A negative result must be combined with clinical observations, patient history, and epidemiological information. The expected result is Negative. Fact Sheet for Patients:  PinkCheek.be Fact Sheet for Healthcare Providers:  GravelBags.it This test is not yet ap proved or cleared by the [place] FDA and  has been authorized for detection and/or diagnosis of SARS-CoV-2 by FDA under an Emergency Use Authorization (EUA).  This EUA will remain  in effect (meaning this test can be used) for the duration of the COVID-19 declaration under Section 564(b)(1) of the Act, 21 U.S.C. section 360bbb-3(b)(1), unless the authorization is terminated or revoked sooner.    Influenza A by PCR NEGATIVE NEGATIVE Final   Influenza B by PCR NEGATIVE NEGATIVE Final    Comment: (NOTE) The Xpert Xpress SARS-CoV-2/FLU/RSV assay is intended as an aid in  the diagnosis of influenza from Nasopharyngeal swab specimens and  should not be used as a sole basis for treatment. Nasal washings and  aspirates are unacceptable for Xpert Xpress SARS-CoV-2/FLU/RSV  testing. Fact Sheet for Patients: PinkCheek.be Fact Sheet for Healthcare Providers: GravelBags.it This test is not yet approved or cleared by the [place] FDA and  has been authorized for detection and/or diagnosis of SARS-CoV-2 by  FDA under an Emergency Use Authorization (EUA). This EUA will remain  in effect (meaning this test can be used) for the duration of the  Covid-19 declaration under Section 564(b)(1) of the Act, 21  U.S.C. section 360bbb-3(b)(1), unless the authorization is  terminated or revoked. Performed at [facility], [place] [address], [place], [place] [address]   MRSA PCR Screening     Status: None   Collection Time: 10/26/19  9:47 AM   Specimen: Nasal Mucosa; Nasopharyngeal  Result Value Ref Range Status   MRSA by PCR NEGATIVE NEGATIVE Final    Comment:        The GeneXpert MRSA Assay (FDA approved for NASAL specimens only), is one component of a comprehensive MRSA colonization surveillance program. It is not intended to diagnose MRSA infection nor to guide or monitor treatment for MRSA infections. Performed at [facility], [place] [address] [place], [place] [address]      Labs: BNP (last 3 results) No results for input(s): BNP in the last 8760  hours. Basic Metabolic Panel: Recent Labs  Lab 10/25/19 2031 10/26/19 0912  NA 141 140  K 3.3* 3.5  CL 110 108  CO2 22 24  GLUCOSE 104* 97  BUN 15 13  CREATININE 0.68 0.59  CALCIUM 9.5 9.2   Liver Function Tests: Recent Labs  Lab 10/26/19 0912  AST 13*  ALT 12  ALKPHOS 52  BILITOT 1.0  PROT 5.8*  ALBUMIN 3.8   No results for input(s): LIPASE, AMYLASE in the last 168 hours. No results for input(s): AMMONIA in the last 168 hours. CBC: Recent Labs  Lab 10/25/19  2031 10/26/19 0912  WBC 8.0 5.5  NEUTROABS 6.9  --   HGB 12.7 11.6*  HCT 38.4 35.8*  MCV 91.0 92.7  PLT 252 226   Cardiac Enzymes: No results for input(s): CKTOTAL, CKMB, CKMBINDEX, TROPONINI in the last 168 hours. BNP: Invalid input(s): POCBNP CBG: No results for input(s): GLUCAP in the last 168 hours. D-Dimer No results for input(s): DDIMER in the last 72 hours. Hgb A1c No results for input(s): HGBA1C in the last 72 hours. Lipid Profile No results for input(s): CHOL, HDL, LDLCALC, TRIG, CHOLHDL, LDLDIRECT in the last 72 hours. Thyroid function studies No results for input(s): TSH, T4TOTAL, T3FREE, THYROIDAB in the last 72 hours.  Invalid input(s): FREET3 Anemia work up No results for input(s): VITAMINB12, FOLATE, FERRITIN, TIBC, IRON, RETICCTPCT in the last 72 hours. Urinalysis No results found for: COLORURINE, APPEARANCEUR, [place], [place], [place], [place], [place], [place], PROTEINUR, UROBILINOGEN, NITRITE, LEUKOCYTESUR Sepsis Labs Invalid input(s): PROCALCITONIN,  WBC,  LACTICIDVEN Microbiology Recent Results (from the past 240 hour(s))  Respiratory Panel by RT PCR (Flu A&B, Covid) - Nasopharyngeal Swab     Status: None   Collection Time: 10/25/19  8:31 PM   Specimen: Nasopharyngeal Swab  Result Value Ref Range Status   SARS Coronavirus 2 by RT PCR NEGATIVE NEGATIVE Final    Comment: (NOTE) SARS-CoV-2 target nucleic acids are NOT DETECTED. The SARS-CoV-2 RNA is generally  detectable in upper respiratoy specimens during the acute phase of infection. The lowest concentration of SARS-CoV-2 viral copies this assay can detect is 131 copies/mL. A negative result does not preclude SARS-Cov-2 infection and should not be used as the sole basis for treatment or other patient management decisions. A negative result may occur with  improper specimen collection/handling, submission of specimen other than nasopharyngeal swab, presence of viral mutation(s) within the areas targeted by this assay, and inadequate number of viral copies (<131 copies/mL). A negative result must be combined with clinical observations, patient history, and epidemiological information. The expected result is Negative. Fact Sheet for Patients:  PinkCheek.be Fact Sheet for Healthcare Providers:  GravelBags.it This test is not yet ap proved or cleared by the [place] FDA and  has been authorized for detection and/or diagnosis of SARS-CoV-2 by FDA under an Emergency Use Authorization (EUA). This EUA will remain  in effect (meaning this test can be used) for the duration of the COVID-19 declaration under Section 564(b)(1) of the Act, 21 U.S.C. section 360bbb-3(b)(1), unless the authorization is terminated or revoked sooner.    Influenza A by PCR NEGATIVE NEGATIVE Final   Influenza B by PCR NEGATIVE NEGATIVE Final    Comment: (NOTE) The Xpert Xpress SARS-CoV-2/FLU/RSV assay is intended as an aid in  the diagnosis of influenza from Nasopharyngeal swab specimens and  should not be used as a sole basis for treatment. Nasal washings and  aspirates are unacceptable for Xpert Xpress SARS-CoV-2/FLU/RSV  testing. Fact Sheet for Patients: PinkCheek.be Fact Sheet for Healthcare Providers: GravelBags.it This test is not yet approved or cleared by the [place] FDA and  has been  authorized for detection and/or diagnosis of SARS-CoV-2 by  FDA under an Emergency Use Authorization (EUA). This EUA will remain  in effect (meaning this test can be used) for the duration of the  Covid-19 declaration under Section 564(b)(1) of the Act, 21  U.S.C. section 360bbb-3(b)(1), unless the authorization is  terminated or revoked. Performed at [facility], [place] [address], [place], [place] [address]   MRSA PCR Screening  Status: None   Collection Time: 10/26/19  9:47 AM   Specimen: Nasal Mucosa; Nasopharyngeal  Result Value Ref Range Status   MRSA by PCR NEGATIVE NEGATIVE Final    Comment:        The GeneXpert MRSA Assay (FDA approved for NASAL specimens only), is one component of a comprehensive MRSA colonization surveillance program. It is not intended to diagnose MRSA infection nor to guide or monitor treatment for MRSA infections. Performed at [facility], [place] [address], [place], [place] [address]      Time coordinating discharge:  39 minutes  SIGNED:   Georgette Shell, MD  [facility] 10/28/2019, 10:00 AM Pager   If 7PM-7AM, please contact night-coverage [email or web address] Password [email or web address]

## 2019-11-03 ENCOUNTER — Telehealth: Payer: Self-pay

## 2019-11-03 ENCOUNTER — Telehealth: Payer: Self-pay | Admitting: Orthopaedic Surgery

## 2019-11-03 NOTE — Telephone Encounter (Signed)
Donna Alvarez with [facility] called stating that patient's right leg is very hot and swollen.  Talked with Gwinda Passe, Dr. Lorin Mercy assistant and stated that patient needs to be seen in the office tomorrow.  Patient has an appointment for tomorrow afternoon with Dr. Lorin Mercy.

## 2019-11-03 NOTE — Telephone Encounter (Signed)
I called discussed. Needs leg up higher for the swelling. She is on Percocet.

## 2019-11-03 NOTE — Telephone Encounter (Signed)
Patient had called and left a VM concerning her right leg being hot and swelling.  When I called patient, she advised that she had spoken with Dr. Lorin Mercy and that he had taken care of everything.

## 2019-11-03 NOTE — Telephone Encounter (Signed)
Please advise 

## 2019-11-03 NOTE — Telephone Encounter (Signed)
Patient called.   She is requesting a change of pain medicines. She feels her current one is not doing what it should.   Call back number: [phone]

## 2019-11-04 ENCOUNTER — Ambulatory Visit (INDEPENDENT_AMBULATORY_CARE_PROVIDER_SITE_OTHER): Payer: Medicare HMO

## 2019-11-04 ENCOUNTER — Other Ambulatory Visit: Payer: Self-pay

## 2019-11-04 ENCOUNTER — Encounter: Payer: Self-pay | Admitting: Orthopaedic Surgery

## 2019-11-04 ENCOUNTER — Ambulatory Visit (INDEPENDENT_AMBULATORY_CARE_PROVIDER_SITE_OTHER): Payer: Medicare HMO | Admitting: Orthopaedic Surgery

## 2019-11-04 VITALS — Ht 60.0 in | Wt 105.0 lb

## 2019-11-04 DIAGNOSIS — M79604 Pain in right leg: Secondary | ICD-10-CM | POA: Diagnosis not present

## 2019-11-04 DIAGNOSIS — S82201D Unspecified fracture of shaft of right tibia, subsequent encounter for closed fracture with routine healing: Secondary | ICD-10-CM

## 2019-11-04 DIAGNOSIS — S82401A Unspecified fracture of shaft of right fibula, initial encounter for closed fracture: Secondary | ICD-10-CM

## 2019-11-04 DIAGNOSIS — S82401D Unspecified fracture of shaft of right fibula, subsequent encounter for closed fracture with routine healing: Secondary | ICD-10-CM

## 2019-11-04 DIAGNOSIS — S82201A Unspecified fracture of shaft of right tibia, initial encounter for closed fracture: Secondary | ICD-10-CM

## 2019-11-04 MED ORDER — HYDROCODONE-ACETAMINOPHEN 5-325 MG PO TABS: 1.0000 | ORAL_TABLET | Freq: Four times a day (QID) | ORAL | 0 refills | Status: DC | PRN

## 2019-11-04 NOTE — Progress Notes (Signed)
Post-Op Visit Note   Patient: Donna Alvarez           Date of Birth: July 24, 1946           MRN: [id number] Visit Date: 11/04/2019 PCP: Parke Poisson, MD   Assessment & Plan: Follow-up tib-fib fracture.  She has some slight erythema not cellulitis with venous stasis with some scaling of her skin she had some fracture blisters.  She is keeping her foot up more elevated than she was and will check her back again in 1 week.  Chief Complaint:  Chief Complaint  Patient presents with  . Right Leg - Routine Post Op    10/26/2019   Visit Diagnoses:  1. Pain in right leg     Plan: Return 1 week continue elevation she will start trying to get her support stocking on she may need to put some gauze over the incision so the support stockings do not rub. Norco for pain. Percocet almost out and was making her itch.   Follow-Up Instructions: No follow-ups on file.   Orders:  Orders Placed This Encounter  Procedures  . XR Tibia/Fibula Right   No orders of the defined types were placed in this encounter.   Imaging: No results found.  PMFS History: Patient Active Problem List   Diagnosis Date Noted  . Essential hypertension 10/26/2019  . Tibia/fibula fracture, shaft, right, closed, initial encounter 10/25/2019  . Closed fracture of right fibula and tibia 10/25/2019   Past Medical History:  Diagnosis Date  . Arthritis    hands, +low back   . 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 Chronic kidney disease    kidney stones - 2013- treated /w surg. procedure   . Complication of anesthesia    with anesth. 10/2010- very emotional & slow to wake up, 12/2010- no problems  . Dysrhythmia   . Hernia   . Hypertension   . Hypothyroidism   . Neuromuscular disorder ([place])    uses Mirapex for RLS  . Thyroid disease     Family History  Problem Relation Age of Onset  . Cancer Father   . Hypertension Father     Past Surgical History:  Procedure  Laterality Date  . childbirth     x2   . CYSTOSCOPY     for stone removal   . HERNIA REPAIR     12/2010- both inguinal & umbilical done at same time  . KNEE ARTHROSCOPY  10/2010   L  . LUMBAR LAMINECTOMY/DECOMPRESSION MICRODISCECTOMY Right 12/25/2012   Procedure: LUMBAR LAMINECTOMY/DECOMPRESSION MICRODISCECTOMY 1 LEVEL;  Surgeon: Ophelia Charter, MD;  Location: [facility];  Service: Neurosurgery;  Laterality: Right;  RIGHT Lumbar five sacral one diskectomy  . LUMBAR LAMINECTOMY/DECOMPRESSION MICRODISCECTOMY Right 05/29/2013   Procedure: LUMBAR LAMINECTOMY/DECOMPRESSION MICRODISCECTOMY 1 LEVEL,RIGHT LUMBAR FIVE-SACRAL ONE;  Surgeon: Ophelia Charter, MD;  Location: [facility];  Service: Neurosurgery;  Laterality: Right;  RIGHT  . MELANOMA EXCISION     posterior area of R leg.  . TIBIA IM NAIL INSERTION Right 10/26/2019   Procedure: INTRAMEDULLARY (IM) NAIL TIBIAL;  Surgeon: [name], MD;  Location: [facility];  Service: Orthopedics;  Laterality: Right;  . TORN CARTILAGE  2012   Social History   Occupational History  . Not on file  Tobacco Use  . Smoking status: Never Smoker  . Smokeless tobacco: Never Used  Substance and Sexual Activity  . Alcohol use: No  .  Drug use: No  . Sexual activity: Not on file

## 2019-11-10 ENCOUNTER — Other Ambulatory Visit: Payer: Self-pay

## 2019-11-10 ENCOUNTER — Ambulatory Visit: Payer: Medicare HMO | Attending: Internal Medicine

## 2019-11-10 DIAGNOSIS — Z20822 Contact with and (suspected) exposure to covid-19: Secondary | ICD-10-CM

## 2019-11-11 ENCOUNTER — Ambulatory Visit: Payer: Self-pay

## 2019-11-11 ENCOUNTER — Inpatient Hospital Stay: Payer: Medicare HMO | Admitting: Orthopaedic Surgery

## 2019-11-11 NOTE — Telephone Encounter (Signed)
Patient for covid results.  Not ready yet.

## 2019-11-12 LAB — NOVEL CORONAVIRUS, NAA: SARS-CoV-2, NAA: DETECTED — AB

## 2019-11-13 ENCOUNTER — Telehealth: Payer: Self-pay | Admitting: Adult Health

## 2019-11-13 NOTE — Telephone Encounter (Signed)
Called to discuss with Nyra Jabs about Covid symptoms and the use of bamlanivimab, a monoclonal antibody infusion for those with mild to moderate Covid symptoms and at a high risk of hospitalization.         Pt does not qualify for infusion therapy as she is admitted receiving treatment .   Patient Active Problem List   Diagnosis Date Noted  . Essential hypertension 10/26/2019  . Tibia/fibula fracture, shaft, right, closed, initial encounter 10/25/2019  . Closed fracture of right fibula and tibia 10/25/2019    Bertrum Helmstetter NP-C  [facility]   11/13/2019

## 2019-11-14 MED ORDER — ONDANSETRON 4 MG PO TBDP
4.00 | ORAL_TABLET | ORAL | Status: DC
Start: ? — End: 2019-11-14

## 2019-11-14 MED ORDER — GENERIC EXTERNAL MEDICATION
6.00 | Status: DC
Start: 2019-11-15 — End: 2019-11-14

## 2019-11-14 MED ORDER — ACETAMINOPHEN 325 MG PO TABS
650.00 | ORAL_TABLET | ORAL | Status: DC
Start: ? — End: 2019-11-14

## 2019-11-14 MED ORDER — ONDANSETRON HCL 4 MG/2ML IJ SOLN
4.00 | INTRAMUSCULAR | Status: DC
Start: ? — End: 2019-11-14

## 2019-11-14 MED ORDER — MELATONIN 3 MG PO TABS
6.00 | ORAL_TABLET | ORAL | Status: DC
Start: ? — End: 2019-11-14

## 2019-11-14 MED ORDER — GENERIC EXTERNAL MEDICATION
1.50 | Status: DC
Start: 2019-11-14 — End: 2019-11-14

## 2019-11-14 MED ORDER — ASPIRIN 81 MG PO CHEW
81.00 | CHEWABLE_TABLET | ORAL | Status: DC
Start: 2019-11-15 — End: 2019-11-14

## 2019-11-14 MED ORDER — ENOXAPARIN SODIUM 40 MG/0.4ML ~~LOC~~ SOLN
40.00 | SUBCUTANEOUS | Status: DC
Start: 2019-11-15 — End: 2019-11-14

## 2019-11-14 MED ORDER — GENERIC EXTERNAL MEDICATION
100.00 | Status: DC
Start: 2019-11-14 — End: 2019-11-14

## 2019-11-14 MED ORDER — LEVOTHYROXINE SODIUM 88 MCG PO TABS
88.00 | ORAL_TABLET | ORAL | Status: DC
Start: 2019-11-15 — End: 2019-11-14

## 2019-11-14 MED ORDER — CLONIDINE HCL 0.1 MG PO TABS
0.10 | ORAL_TABLET | ORAL | Status: DC
Start: ? — End: 2019-11-14

## 2019-11-18 ENCOUNTER — Inpatient Hospital Stay: Payer: Medicare HMO | Admitting: Orthopaedic Surgery

## 2019-11-21 ENCOUNTER — Ambulatory Visit (INDEPENDENT_AMBULATORY_CARE_PROVIDER_SITE_OTHER): Payer: Medicare HMO

## 2019-11-21 ENCOUNTER — Other Ambulatory Visit: Payer: Self-pay

## 2019-11-21 ENCOUNTER — Encounter: Payer: Self-pay | Admitting: Orthopaedic Surgery

## 2019-11-21 ENCOUNTER — Ambulatory Visit (INDEPENDENT_AMBULATORY_CARE_PROVIDER_SITE_OTHER): Payer: Medicare HMO | Admitting: Orthopaedic Surgery

## 2019-11-21 VITALS — Ht 60.0 in | Wt 105.0 lb

## 2019-11-21 DIAGNOSIS — S82201A Unspecified fracture of shaft of right tibia, initial encounter for closed fracture: Secondary | ICD-10-CM | POA: Diagnosis not present

## 2019-11-21 DIAGNOSIS — S82401D Unspecified fracture of shaft of right fibula, subsequent encounter for closed fracture with routine healing: Secondary | ICD-10-CM

## 2019-11-21 DIAGNOSIS — S82401A Unspecified fracture of shaft of right fibula, initial encounter for closed fracture: Secondary | ICD-10-CM | POA: Diagnosis not present

## 2019-11-21 DIAGNOSIS — S82201D Unspecified fracture of shaft of right tibia, subsequent encounter for closed fracture with routine healing: Secondary | ICD-10-CM

## 2019-11-21 NOTE — Progress Notes (Signed)
Post-Op Visit Note   Patient: Donna Alvarez           Date of Birth: January 06, 1946           MRN: [id number] Visit Date: 11/21/2019 PCP: Parke Poisson, MD   Assessment & Plan: Jodell Cipro removed return in 6 weeks for recheck.  Chief Complaint:  Chief Complaint  Patient presents with  . Right Leg - Follow-up    10/26/2019 IM Nail Right Tibia   Visit Diagnoses:  1. Tibia/fibula fracture, shaft, right, closed, initial encounter   2. Closed fracture of right tibia and fibula with routine healing, subsequent encounter     Plan: Patient admitted home with a tibial fracture she and her daughter both came down with Covid the same day now off quarantine.  She had some symptoms with drop in her O2 saturation.z  Follow-Up Instructions: Return in about 7 weeks (around 01/09/2020).   Orders:  Orders Placed This Encounter  Procedures  . XR Tibia/Fibula Right   No orders of the defined types were placed in this encounter.   Imaging: XR Tibia/Fibula Right  Result Date: 11/21/2019 AP lateral right tib-fib x-rays obtained and reviewed.  This shows tibial nail with interlocks in satisfactory position unchanged from 10/25/2018 1C arm images.  Patient has segmental fibular fracture with proximal fibular neck fracture and fibular fracture just distal to the tibia. Impression: Satisfactory tibial nail images.   PMFS History: Patient Active Problem List   Diagnosis Date Noted  . Essential hypertension 10/26/2019  . Tibia/fibula fracture, shaft, right, closed, initial encounter 10/25/2019  . Closed fracture of right fibula and tibia 10/25/2019   Past Medical History:  Diagnosis Date  . Arthritis    hands, +low back   . 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 Chronic kidney disease    kidney stones - 2013- treated /w surg. procedure   . Complication of anesthesia    with anesth. 10/2010- very emotional & slow to wake up, 12/2010- no problems  .  Dysrhythmia   . Hernia   . Hypertension   . Hypothyroidism   . Neuromuscular disorder ([place])    uses Mirapex for RLS  . Thyroid disease     Family History  Problem Relation Age of Onset  . Cancer Father   . Hypertension Father     Past Surgical History:  Procedure Laterality Date  . childbirth     x2   . CYSTOSCOPY     for stone removal   . HERNIA REPAIR     12/2010- both inguinal & umbilical done at same time  . KNEE ARTHROSCOPY  10/2010   L  . LUMBAR LAMINECTOMY/DECOMPRESSION MICRODISCECTOMY Right 12/25/2012   Procedure: LUMBAR LAMINECTOMY/DECOMPRESSION MICRODISCECTOMY 1 LEVEL;  Surgeon: Ophelia Charter, MD;  Location: [facility];  Service: Neurosurgery;  Laterality: Right;  RIGHT Lumbar five sacral one diskectomy  . LUMBAR LAMINECTOMY/DECOMPRESSION MICRODISCECTOMY Right 05/29/2013   Procedure: LUMBAR LAMINECTOMY/DECOMPRESSION MICRODISCECTOMY 1 LEVEL,RIGHT LUMBAR FIVE-SACRAL ONE;  Surgeon: Ophelia Charter, MD;  Location: [facility];  Service: Neurosurgery;  Laterality: Right;  RIGHT  . MELANOMA EXCISION     posterior area of R leg.  . TIBIA IM NAIL INSERTION Right 10/26/2019   Procedure: INTRAMEDULLARY (IM) NAIL TIBIAL;  Surgeon: [name], MD;  Location: [facility];  Service: Orthopedics;  Laterality: Right;  . TORN CARTILAGE  2012   Social History   Occupational History  . Not  on file  Tobacco Use  . Smoking status: Never Smoker  . Smokeless tobacco: Never Used  Substance and Sexual Activity  . Alcohol use: No  . Drug use: No  . Sexual activity: Not on file

## 2019-12-08 ENCOUNTER — Telehealth: Payer: Self-pay | Admitting: Radiology

## 2019-12-08 NOTE — Telephone Encounter (Signed)
[name] with [facility] would like to know patient's weightbearing status. Please advise.  CB [phone]

## 2019-12-08 NOTE — Telephone Encounter (Signed)
WBAT thanks

## 2019-12-08 NOTE — Telephone Encounter (Signed)
I called [name] and advised. Patient also asked when she would be able to drive. I informed [name] that is something she can discuss with Dr. Lorin Mercy at the next office visit.

## 2020-01-06 ENCOUNTER — Other Ambulatory Visit: Payer: Self-pay

## 2020-01-06 ENCOUNTER — Ambulatory Visit (INDEPENDENT_AMBULATORY_CARE_PROVIDER_SITE_OTHER): Payer: Medicare HMO | Admitting: Orthopaedic Surgery

## 2020-01-06 ENCOUNTER — Ambulatory Visit (INDEPENDENT_AMBULATORY_CARE_PROVIDER_SITE_OTHER): Payer: Medicare HMO

## 2020-01-06 ENCOUNTER — Encounter: Payer: Self-pay | Admitting: Orthopaedic Surgery

## 2020-01-06 VITALS — BP 142/73 | HR 73 | Ht 60.0 in | Wt 105.0 lb

## 2020-01-06 DIAGNOSIS — S82401D Unspecified fracture of shaft of right fibula, subsequent encounter for closed fracture with routine healing: Secondary | ICD-10-CM | POA: Diagnosis not present

## 2020-01-06 DIAGNOSIS — S82201D Unspecified fracture of shaft of right tibia, subsequent encounter for closed fracture with routine healing: Secondary | ICD-10-CM

## 2020-01-06 NOTE — Progress Notes (Signed)
Post-Op Visit Note   Patient: Donna Alvarez           Date of Birth: 12-15-1945           MRN: [id number] Visit Date: 01/06/2020 PCP: Parke Poisson, MD   Assessment & Plan: 74 year old female returns post intramedullary nail.  She has had increased pain now notices that mid to distal area of the tibia anteriorly is become red and shiny with increased edema.  Increased discomfort she has been applying some sort of topical cream that has some lidocaine in it over the skin as well as lotion.  Support stockings seem like they were getting tight so she stopped using them and now has increased edema.  She been active no longer using a walker and has been weightbearing as tolerated.  X-ray shows second the bottom screw at the distal interlock has backed up and is only in one cortex is 50% backed out.  Distalmost screw still solid.  No motion of the rod.  Fibula fracture looks like it is bridged and healed the tibia is lagging behind.  Chief Complaint:  Chief Complaint  Patient presents with  . Right Leg - Follow-up    10/26/2019 IM Nail Right Tibia   Visit Diagnoses:  1. Closed fracture of right tibia and fibula with routine healing, subsequent encounter     Plan: Patient continue the boot need to slow down use her walker and just touchdown weight-bear.  She needs to go home keep her foot elevated and start wearing her support stocking again for the swelling that she has.  We discussed venous insufficiency she has bilateral pitting edema and some skin darkening on the opposite left pretibial surface consistent with venous insufficiency.  When she gets the swelling decreases weights she should have full healing of the tibia.  Return 4 weeks for repeat x-rays AP and lateral of the distal one half of the tibia.  Follow-Up Instructions: Return in about 4 weeks (around 02/03/2020).   Orders:  Orders Placed This Encounter  Procedures  . XR Tibia/Fibula Right   No orders of the defined  types were placed in this encounter.   Imaging: No results found.  PMFS History: Patient Active Problem List   Diagnosis Date Noted  . Essential hypertension 10/26/2019  . Tibia/fibula fracture, shaft, right, closed, initial encounter 10/25/2019  . Closed fracture of right fibula and tibia 10/25/2019   Past Medical History:  Diagnosis Date  . Arthritis    hands, +low back   . 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 Chronic kidney disease    kidney stones - 2013- treated /w surg. procedure   . Complication of anesthesia    with anesth. 10/2010- very emotional & slow to wake up, 12/2010- no problems  . Dysrhythmia   . Hernia   . Hypertension   . Hypothyroidism   . Neuromuscular disorder ([place])    uses Mirapex for RLS  . Thyroid disease     Family History  Problem Relation Age of Onset  . Cancer Father   . Hypertension Father     Past Surgical History:  Procedure Laterality Date  . childbirth     x2   . CYSTOSCOPY     for stone removal   . HERNIA REPAIR     12/2010- both inguinal & umbilical done at same time  . KNEE ARTHROSCOPY  10/2010   L  . LUMBAR LAMINECTOMY/DECOMPRESSION MICRODISCECTOMY  Right 12/25/2012   Procedure: LUMBAR LAMINECTOMY/DECOMPRESSION MICRODISCECTOMY 1 LEVEL;  Surgeon: Ophelia Charter, MD;  Location: [facility];  Service: Neurosurgery;  Laterality: Right;  RIGHT Lumbar five sacral one diskectomy  . LUMBAR LAMINECTOMY/DECOMPRESSION MICRODISCECTOMY Right 05/29/2013   Procedure: LUMBAR LAMINECTOMY/DECOMPRESSION MICRODISCECTOMY 1 LEVEL,RIGHT LUMBAR FIVE-SACRAL ONE;  Surgeon: Ophelia Charter, MD;  Location: [facility];  Service: Neurosurgery;  Laterality: Right;  RIGHT  . MELANOMA EXCISION     posterior area of R leg.  . TIBIA IM NAIL INSERTION Right 10/26/2019   Procedure: INTRAMEDULLARY (IM) NAIL TIBIAL;  Surgeon: [name], MD;  Location: [facility];  Service: Orthopedics;  Laterality: Right;  . TORN CARTILAGE  2012    Social History   Occupational History  . Not on file  Tobacco Use  . Smoking status: Never Smoker  . Smokeless tobacco: Never Used  Substance and Sexual Activity  . Alcohol use: No  . Drug use: No  . Sexual activity: Not on file

## 2020-01-07 ENCOUNTER — Ambulatory Visit: Payer: Medicare HMO | Admitting: Orthopaedic Surgery

## 2020-02-17 IMAGING — RF DG TIBIA/FIBULA 2V*R*
1 series · 4 of 4 positions shown · non-contrast
Comparison: Ankle radiograph 10/25/2019

CLINICAL DATA: Right tibial nail

EXAM:
RIGHT TIBIA AND FIBULA - 2 VIEW; DG C-ARM 1-60 MIN-NO REPORT

[Series 1: unknown protocol · 0.20mm/px · 4 of 4 slices shown]
[im 1/4]
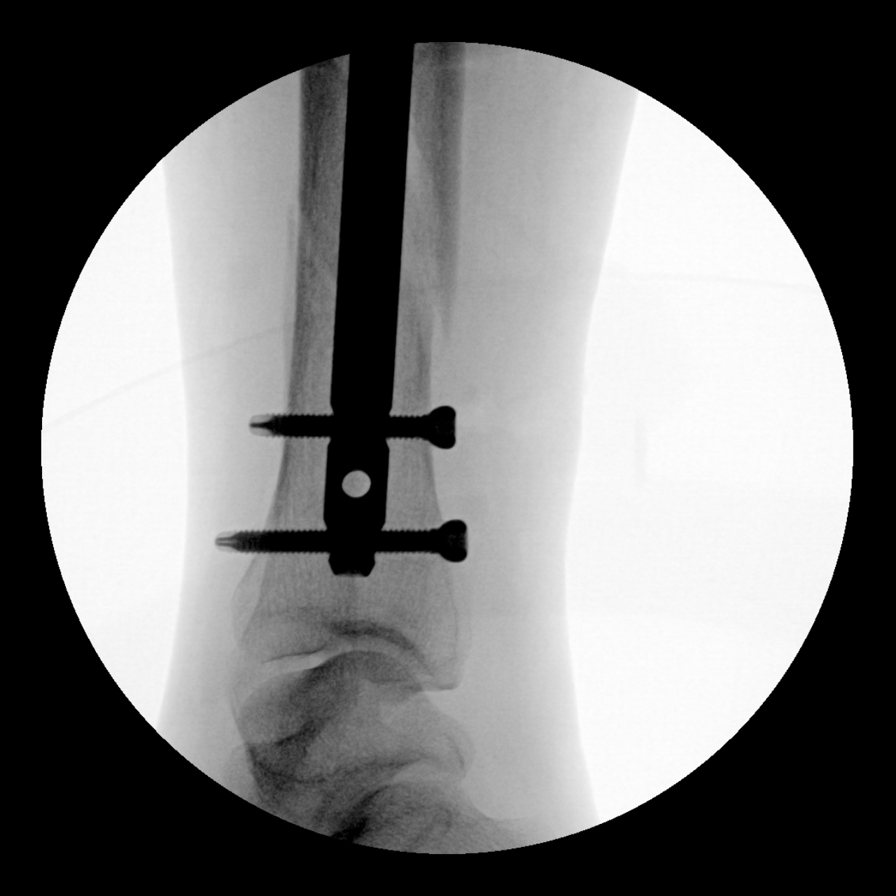
[im 2/4]
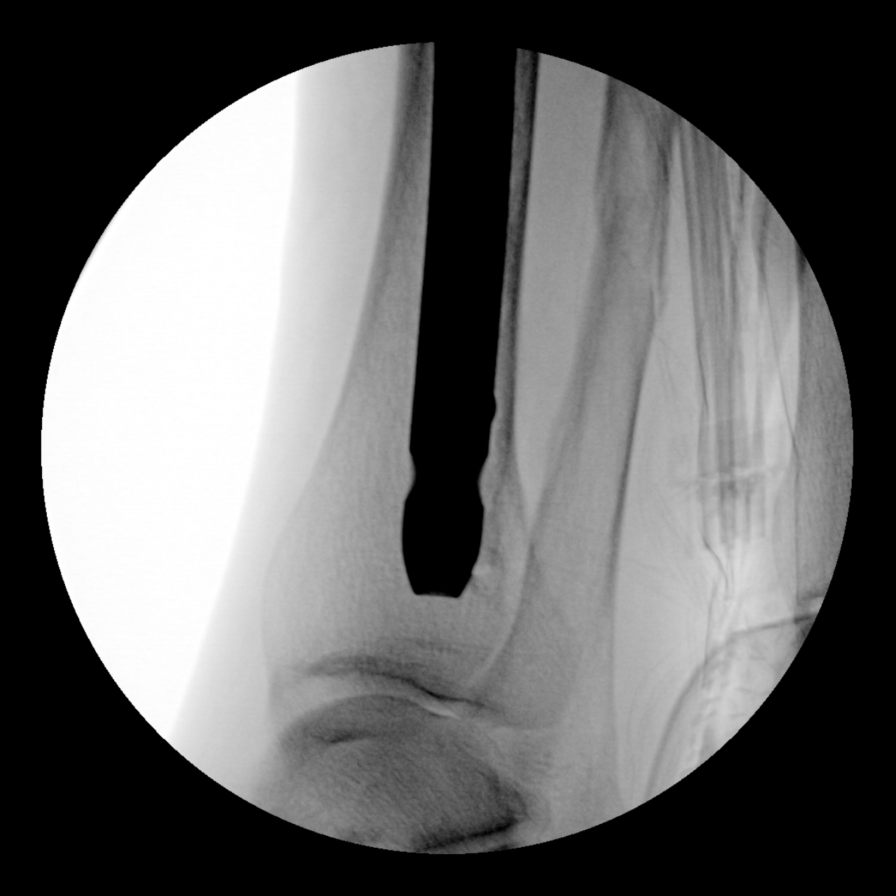
[im 3/4]
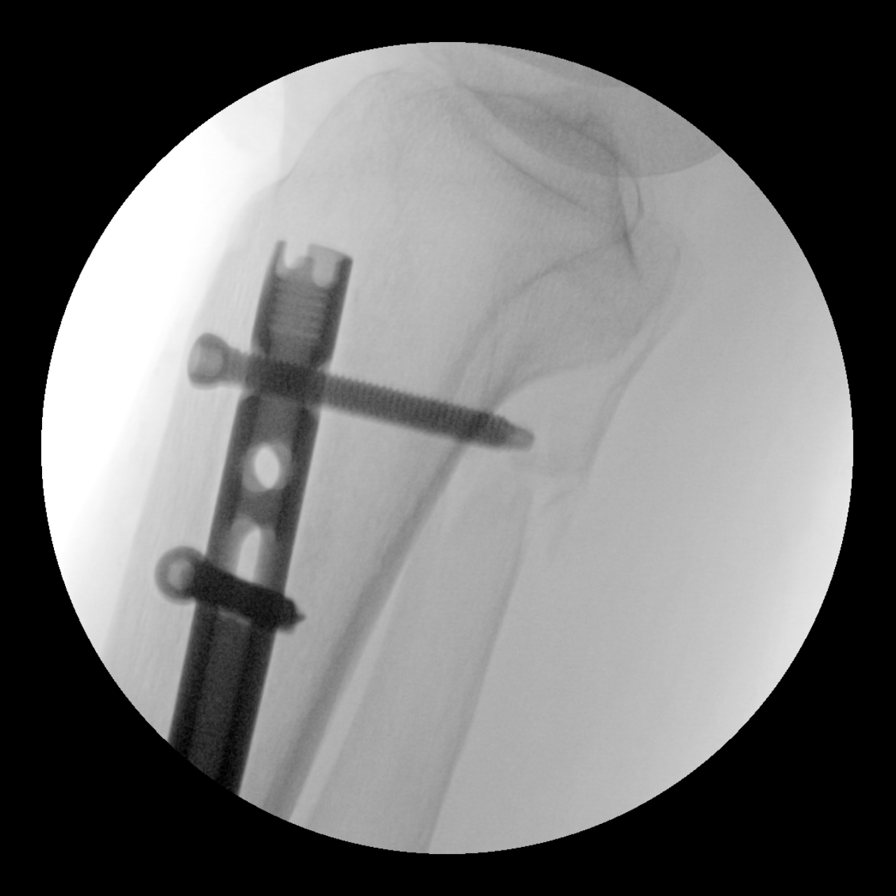
[im 4/4]
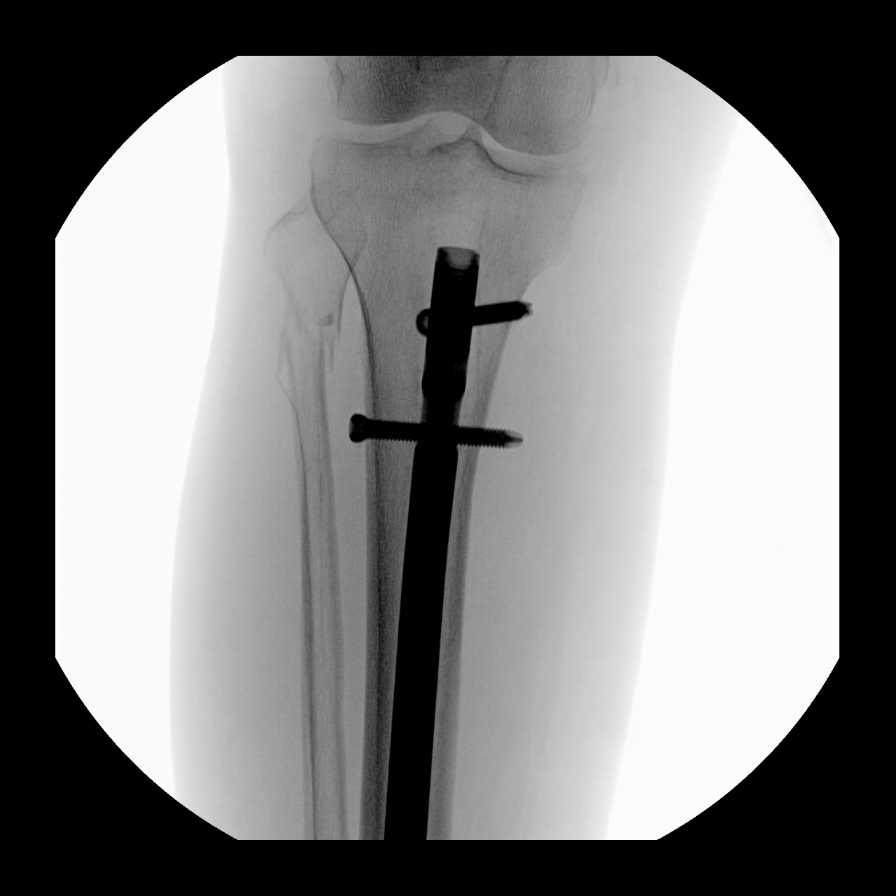

[4 of 4 positions shown; findings below may reference images not displayed]

FINDINGS: Fluoroscopic images depict interval placement of a right tibial
intramedullary nail secured proximally and distally with fully
threaded screws within the proximal and distal metadiaphysis. No
acute hardware complication. There is improved alignment of the
distal tibial spiral fracture. Additional proximal and distal
fibular fractures are seen as well.
IMPRESSION: Status post intramedullary nail fixation of the right tibia. No
acute hardware complication.

Proximal and distal fibular fractures are present.

## 2020-02-17 IMAGING — RF DG C-ARM 1-60 MIN-NO REPORT
1 series · 4 of 4 positions shown · non-contrast
Comparison: Ankle radiograph 10/25/2019

CLINICAL DATA: Right tibial nail

EXAM:
RIGHT TIBIA AND FIBULA - 2 VIEW; DG C-ARM 1-60 MIN-NO REPORT

[Series 1: unknown protocol · 0.20mm/px · 4 of 4 slices shown]
[im 1/4]
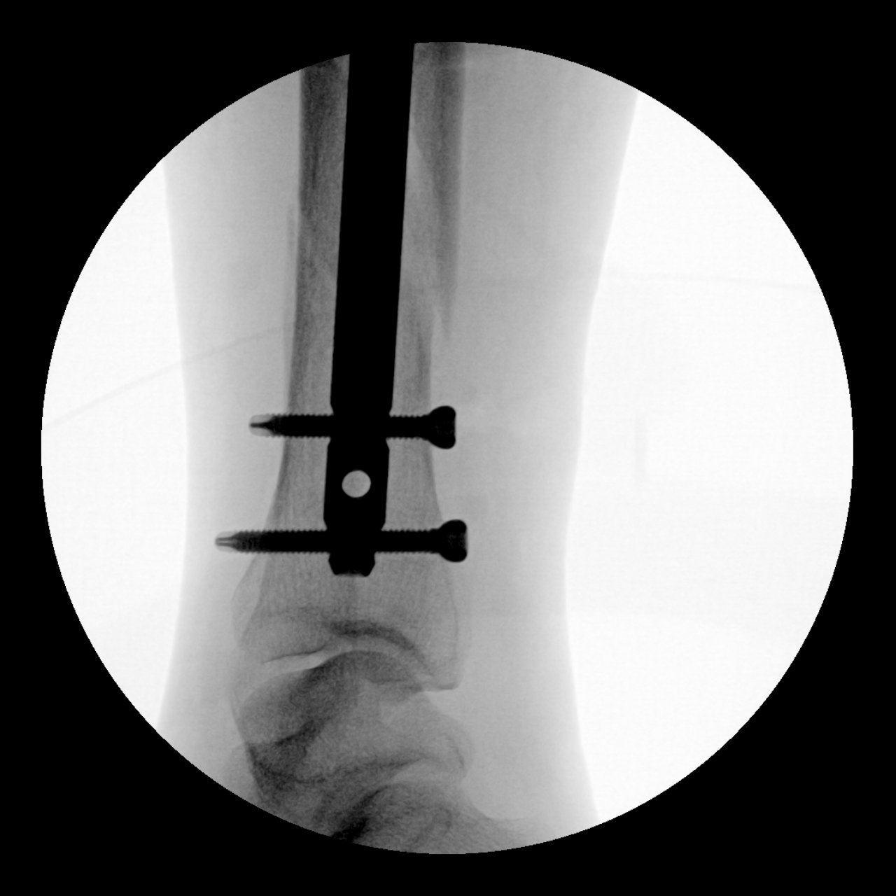
[im 2/4]
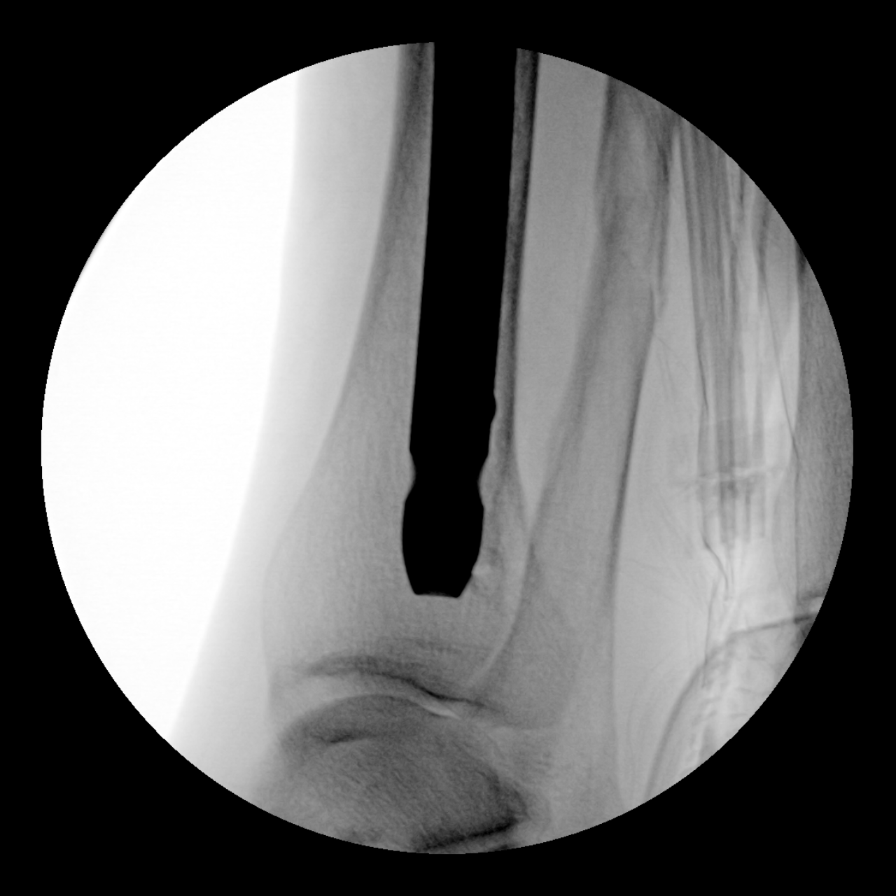
[im 3/4]
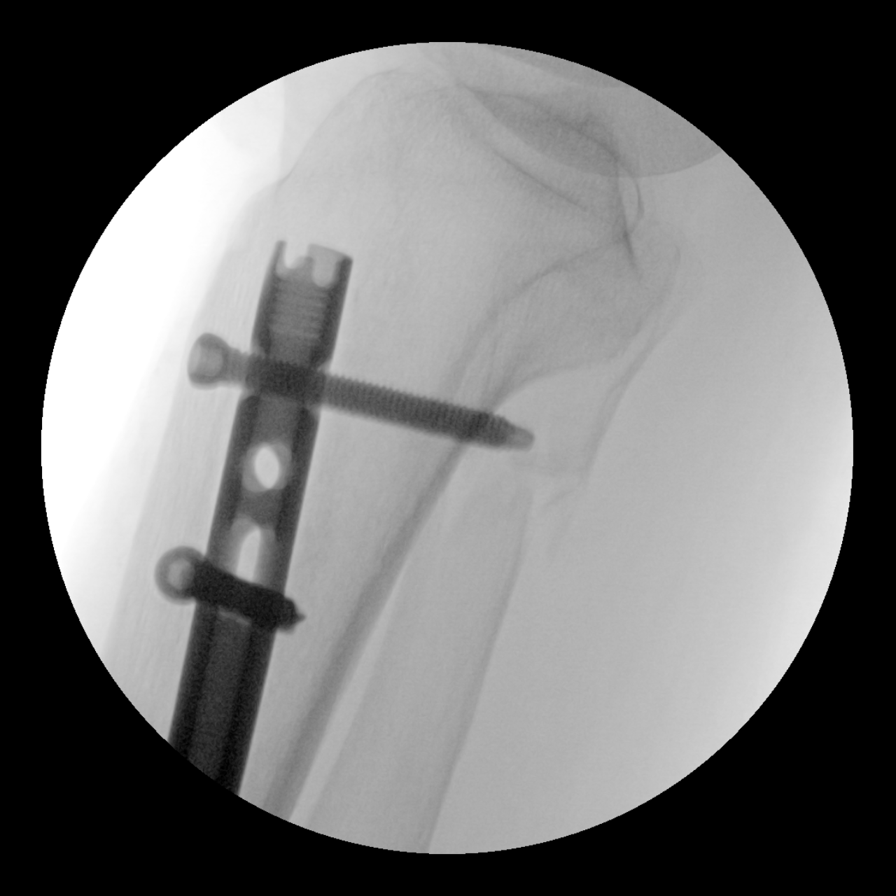
[im 4/4]
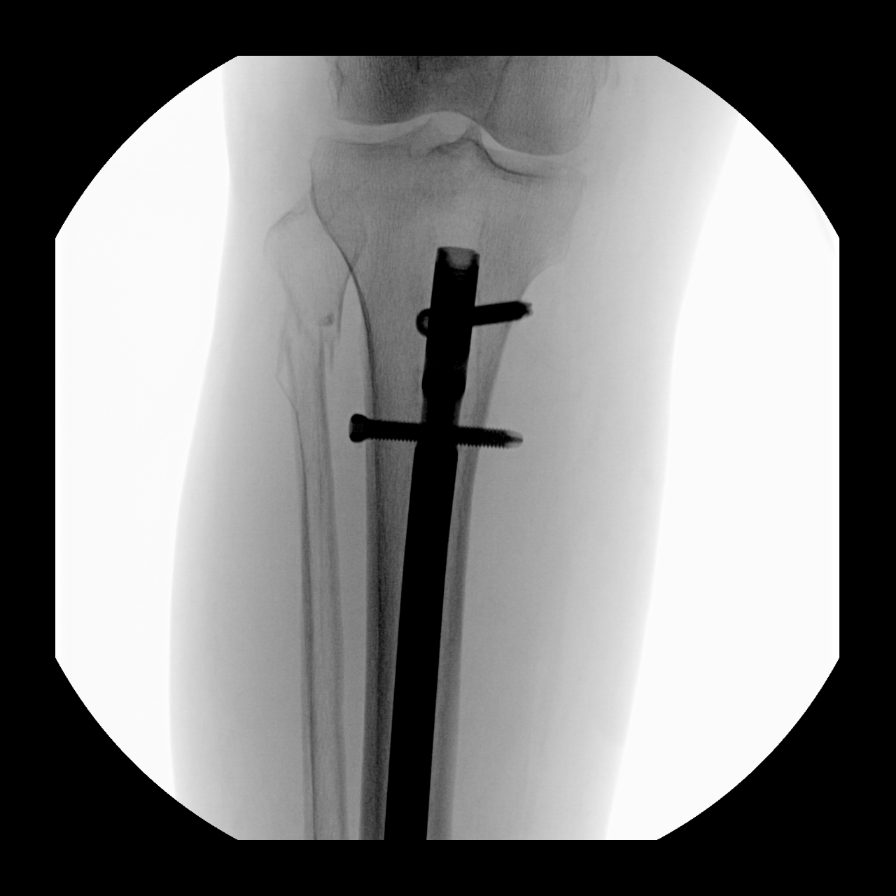

[4 of 4 positions shown; findings below may reference images not displayed]

FINDINGS: Fluoroscopic images depict interval placement of a right tibial
intramedullary nail secured proximally and distally with fully
threaded screws within the proximal and distal metadiaphysis. No
acute hardware complication. There is improved alignment of the
distal tibial spiral fracture. Additional proximal and distal
fibular fractures are seen as well.
IMPRESSION: Status post intramedullary nail fixation of the right tibia. No
acute hardware complication.

Proximal and distal fibular fractures are present.

## 2020-06-07 ENCOUNTER — Encounter: Payer: Self-pay | Admitting: Orthopaedic Surgery

## 2020-06-15 ENCOUNTER — Encounter: Payer: Self-pay | Admitting: Orthopaedic Surgery

## 2020-06-15 ENCOUNTER — Ambulatory Visit (INDEPENDENT_AMBULATORY_CARE_PROVIDER_SITE_OTHER): Payer: Medicare HMO

## 2020-06-15 ENCOUNTER — Ambulatory Visit (INDEPENDENT_AMBULATORY_CARE_PROVIDER_SITE_OTHER): Payer: Medicare HMO | Admitting: Orthopaedic Surgery

## 2020-06-15 VITALS — Ht 60.0 in | Wt 112.0 lb

## 2020-06-15 DIAGNOSIS — S82401D Unspecified fracture of shaft of right fibula, subsequent encounter for closed fracture with routine healing: Secondary | ICD-10-CM

## 2020-06-15 DIAGNOSIS — S82201D Unspecified fracture of shaft of right tibia, subsequent encounter for closed fracture with routine healing: Secondary | ICD-10-CM

## 2020-06-15 NOTE — Progress Notes (Signed)
Office Visit Note   Patient: Donna Alvarez           Date of Birth: Jan 18, 1946           MRN: [id number] Visit Date: 06/15/2020              Requested by: Donna Poisson, MD [place],  [place] [address] PCP: Donna Poisson, MD   Assessment & Plan: Visit Diagnoses:  1. Closed fracture of right tibia and fibula with routine healing, subsequent encounter     Plan: Patient's fracture is healed bottom screws bent and second on the bottom screw which backed out has stayed in stable condition but is not protruding or tenting the skin and is not painful.  She needs no specific treatment at this time we will recheck him for likely follow up final visit in 6 months.  Follow-Up Instructions: Return in about 6 months (around 12/13/2020).   Orders:  Orders Placed This Encounter  Procedures  . XR Tibia/Fibula Right   No orders of the defined types were placed in this encounter.     Procedures: No procedures performed   Clinical Data: No additional findings.   Subjective: Chief Complaint  Patient presents with  . Right Leg - Follow-up    HPI follow-up tibia fracture from January 2021.  Patient postoperatively had Covid and also the daughter that she was staying with had Covid.  She states in March she had some problems with shortness of breath has a pulmonary appointment at [facility] coming up.  X-rays taken today demonstrates the tib-fib fracture is completely healed.  Review of Systems unchanged from previous surgery in January other than some shortness of breath.   Objective: Vital Signs: Ht 5' (1.524 m)   Wt 112 lb (50.8 kg)   BMI 21.87 kg/m   Physical Exam Constitutional:      Appearance: She is well-developed.  HENT:     Head: Normocephalic.     Right Ear: External ear normal.     Left Ear: External ear normal.  Eyes:     Pupils: Pupils are equal, round, and reactive to light.  Neck:     Thyroid: No thyromegaly.      Trachea: No tracheal deviation.  Cardiovascular:     Rate and Rhythm: Normal rate.  Pulmonary:     Effort: Pulmonary effort is normal.  Abdominal:     Palpations: Abdomen is soft.  Skin:    General: Skin is warm and dry.  Neurological:     Mental Status: She is alert and oriented to person, place, and time.  Psychiatric:        Behavior: Behavior normal.     Ortho Exam patient has some bilateral lower extremity edema mild discoloration pretibial area.  She has zip up compression stockings that she wears.  Screws palpable posterior medial distal third of the tibia but not particularly tender with palpation.  Posterior tibial function is intact posterior tibial nerve sensation is intact.  Specialty Comments:  No specialty comments available.  Imaging: XR Tibia/Fibula Right  Result Date: 06/15/2020 AP lateral tib-fib x-rays are obtained and reviewed.  This shows second from the bottom distal interlock screw remains backed out.  Distalmost screws bent but her tib-fib fracture is completely healed and is bridged across all cortices. Impression: Healed Right  tib-fib fracture.  1 screw backed out and the distalmost screw has some bend in it.  Distalmost screw does not appear to  be broken.    PMFS History: Patient Active Problem List   Diagnosis Date Noted  . Essential hypertension 10/26/2019  . Tibia/fibula fracture, shaft, right, closed, initial encounter 10/25/2019  . Closed fracture of right fibula and tibia 10/25/2019   Past Medical History:  Diagnosis Date  . Arthritis    hands, +low back   . 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 Chronic kidney disease    kidney stones - 2013- treated /w surg. procedure   . Complication of anesthesia    with anesth. 10/2010- very emotional & slow to wake up, 12/2010- no problems  . Dysrhythmia   . Hernia   . Hypertension   . Hypothyroidism   . Neuromuscular disorder ([place])    uses Mirapex for RLS  .  Thyroid disease     Family History  Problem Relation Age of Onset  . Cancer Father   . Hypertension Father     Past Surgical History:  Procedure Laterality Date  . childbirth     x2   . CYSTOSCOPY     for stone removal   . HERNIA REPAIR     12/2010- both inguinal & umbilical done at same time  . KNEE ARTHROSCOPY  10/2010   L  . LUMBAR LAMINECTOMY/DECOMPRESSION MICRODISCECTOMY Right 12/25/2012   Procedure: LUMBAR LAMINECTOMY/DECOMPRESSION MICRODISCECTOMY 1 LEVEL;  Surgeon: Ophelia Charter, MD;  Location: [facility];  Service: Neurosurgery;  Laterality: Right;  RIGHT Lumbar five sacral one diskectomy  . LUMBAR LAMINECTOMY/DECOMPRESSION MICRODISCECTOMY Right 05/29/2013   Procedure: LUMBAR LAMINECTOMY/DECOMPRESSION MICRODISCECTOMY 1 LEVEL,RIGHT LUMBAR FIVE-SACRAL ONE;  Surgeon: Ophelia Charter, MD;  Location: [facility];  Service: Neurosurgery;  Laterality: Right;  RIGHT  . MELANOMA EXCISION     posterior area of R leg.  . TIBIA IM NAIL INSERTION Right 10/26/2019   Procedure: INTRAMEDULLARY (IM) NAIL TIBIAL;  Surgeon: [name], MD;  Location: [facility];  Service: Orthopedics;  Laterality: Right;  . TORN CARTILAGE  2012   Social History   Occupational History  . Not on file  Tobacco Use  . Smoking status: Never Smoker  . Smokeless tobacco: Never Used  Substance and Sexual Activity  . Alcohol use: No  . Drug use: No  . Sexual activity: Not on file

## 2022-02-13 ENCOUNTER — Encounter (INDEPENDENT_AMBULATORY_CARE_PROVIDER_SITE_OTHER): Payer: Medicare HMO | Admitting: Ophthalmology

## 2022-07-12 ENCOUNTER — Ambulatory Visit: Payer: Medicare HMO | Admitting: Orthopaedic Surgery

## 2022-07-18 ENCOUNTER — Ambulatory Visit (INDEPENDENT_AMBULATORY_CARE_PROVIDER_SITE_OTHER): Payer: Medicare HMO

## 2022-07-18 ENCOUNTER — Ambulatory Visit: Payer: Medicare HMO | Admitting: Orthopaedic Surgery

## 2022-07-18 ENCOUNTER — Encounter: Payer: Self-pay | Admitting: Orthopaedic Surgery

## 2022-07-18 VITALS — BP 135/84 | HR 96 | Ht 60.0 in | Wt 115.0 lb

## 2022-07-18 DIAGNOSIS — S82201D Unspecified fracture of shaft of right tibia, subsequent encounter for closed fracture with routine healing: Secondary | ICD-10-CM | POA: Diagnosis not present

## 2022-07-18 DIAGNOSIS — S82401D Unspecified fracture of shaft of right fibula, subsequent encounter for closed fracture with routine healing: Secondary | ICD-10-CM

## 2022-07-18 NOTE — Progress Notes (Signed)
Office Visit Note   Patient: Donna Alvarez           Date of Birth: 11/09/1945           MRN: [id number] Visit Date: 07/18/2022              Requested by: Parke Poisson, MD [place],  [place] [address] PCP: Parke Poisson, MD   Assessment & Plan: Visit Diagnoses:  1. Closed fracture of right tibia and fibula with routine healing, subsequent encounter     Plan: Patient like to have the screw removed attending skin.  The screws been in my note be able to be removed.  No problems with the proximal screw.  This is a Zimmer recon nail and we only need to remove the single interlock screw that backed up out of the nail.  Follow-Up Instructions: No follow-ups on file.   Orders:  Orders Placed This Encounter  Procedures   XR Tibia/Fibula Right   No orders of the defined types were placed in this encounter.     Procedures: No procedures performed   Clinical Data: No additional findings.   Subjective: Chief Complaint  Patient presents with   Right Leg - Pain    10/26/2019 IM Nail right tibia    HPI 76 year old female returns had intramedullary nailing for tibia fracture 10/26/2019 which healed completely.  She bit her bottom interlock screw and second most distal interlock screw backed out to the subcutaneous tissue and now is progressively gotten painful or its tenting the skin and she wants to have it removed.  Patient had problems with restless legs from sleeping at night.  Not able to lay on her right side.  She uses Tylenol and Aleve.  Review of Systems previous lumbar laminectomy by Dr. Arnoldo Morale 2014.  She has degenerative anterolisthesis at L4-5 without claudication symptoms.  Positive smoking history.  Positive for venous insufficiency lower extremities.   Objective: Vital Signs: BP 135/84   Pulse 96   Ht 5' (1.524 m)   Wt 115 lb (52.2 kg)   BMI 22.46 kg/m   Physical Exam Constitutional:      Appearance: She is  well-developed.  HENT:     Head: Normocephalic.     Right Ear: External ear normal.     Left Ear: External ear normal. There is no impacted cerumen.  Eyes:     Pupils: Pupils are equal, round, and reactive to light.  Neck:     Thyroid: No thyromegaly.     Trachea: No tracheal deviation.  Cardiovascular:     Rate and Rhythm: Normal rate.  Pulmonary:     Effort: Pulmonary effort is normal.  Abdominal:     Palpations: Abdomen is soft.  Musculoskeletal:     Cervical back: No rigidity.  Skin:    General: Skin is warm and dry.  Neurological:     Mental Status: She is alert and oriented to person, place, and time.  Psychiatric:        Behavior: Behavior normal.     Ortho Exam tender right leg just above the ankle medially with palpable screw that backed up tenting the skin but is not protruding through the skin.  Distalmost screw is down against the bone and nontender.  Specialty Comments:  No specialty comments available.  Imaging: No results found.   PMFS History: Patient Active Problem List   Diagnosis Date Noted   Essential hypertension 10/26/2019   Tibia/fibula fracture,  shaft, right, closed, initial encounter 10/25/2019   Closed fracture of right fibula and tibia 10/25/2019   Past Medical History:  Diagnosis Date   Arthritis    hands, +low back    Cancer ([place])    skin- 05/2012 squamous cell carcinoma removed from leg   Chronic kidney disease    kidney stones - 2013- treated /w surg. procedure    Complication of anesthesia    with anesth. 10/2010- very emotional & slow to wake up, 12/2010- no problems   Dysrhythmia    Hernia    Hypertension    Hypothyroidism    Neuromuscular disorder ([place])    uses Mirapex for RLS   Thyroid disease     Family History  Problem Relation Age of Onset   Cancer Father    Hypertension Father     Past Surgical History:  Procedure Laterality Date   childbirth     x2    CYSTOSCOPY     for stone removal    HERNIA REPAIR      12/2010- both inguinal & umbilical done at same time   KNEE ARTHROSCOPY  10/2010   L   LUMBAR LAMINECTOMY/DECOMPRESSION MICRODISCECTOMY Right 12/25/2012   Procedure: LUMBAR LAMINECTOMY/DECOMPRESSION MICRODISCECTOMY 1 LEVEL;  Surgeon: Ophelia Charter, MD;  Location: [facility];  Service: Neurosurgery;  Laterality: Right;  RIGHT Lumbar five sacral one diskectomy   LUMBAR LAMINECTOMY/DECOMPRESSION MICRODISCECTOMY Right 05/29/2013   Procedure: LUMBAR LAMINECTOMY/DECOMPRESSION MICRODISCECTOMY 1 LEVEL,RIGHT LUMBAR FIVE-SACRAL ONE;  Surgeon: Ophelia Charter, MD;  Location: [facility];  Service: Neurosurgery;  Laterality: Right;  RIGHT   MELANOMA EXCISION     posterior area of R leg.   TIBIA IM NAIL INSERTION Right 10/26/2019   Procedure: INTRAMEDULLARY (IM) NAIL TIBIAL;  Surgeon: [name], MD;  Location: [facility];  Service: Orthopedics;  Laterality: Right;   TORN CARTILAGE  2012   Social History   Occupational History   Not on file  Tobacco Use   Smoking status: Never   Smokeless tobacco: Never  Substance and Sexual Activity   Alcohol use: No   Drug use: No   Sexual activity: Not on file

## 2022-08-22 ENCOUNTER — Encounter: Payer: BC Managed Care – PPO | Admitting: Orthopaedic Surgery

## 2024-09-22 NOTE — Telephone Encounter (Signed)
 Pt LMOM stating [facility] [place] has made her an appointment with the wound center about her legs on Wednesday 09/24/24. She currently has bilateral Warehouse Manager on with an appointment with you on Thursday 09/25/24. Pt would like to know if she should cancel the wound center appointment. In message stated that if she would only like a call back if she should cancel the wound center appointment.

## 2024-09-24 ENCOUNTER — Encounter (HOSPITAL_BASED_OUTPATIENT_CLINIC_OR_DEPARTMENT_OTHER): Attending: Internal Medicine | Admitting: Internal Medicine

## 2024-09-24 DIAGNOSIS — Z09 Encounter for follow-up examination after completed treatment for conditions other than malignant neoplasm: Secondary | ICD-10-CM | POA: Insufficient documentation

## 2024-09-24 DIAGNOSIS — I87303 Chronic venous hypertension (idiopathic) without complications of bilateral lower extremity: Secondary | ICD-10-CM | POA: Diagnosis not present

## 2024-09-29 ENCOUNTER — Encounter (HOSPITAL_BASED_OUTPATIENT_CLINIC_OR_DEPARTMENT_OTHER): Payer: Self-pay | Admitting: Emergency Medicine

## 2024-09-29 ENCOUNTER — Emergency Department (HOSPITAL_BASED_OUTPATIENT_CLINIC_OR_DEPARTMENT_OTHER): Admitting: Radiology

## 2024-09-29 ENCOUNTER — Inpatient Hospital Stay (HOSPITAL_BASED_OUTPATIENT_CLINIC_OR_DEPARTMENT_OTHER)
Admission: EM | Admit: 2024-09-29 | Discharge: 2024-10-02 | Disposition: A | Discharge status: Home / Self Care | Attending: Internal Medicine | Admitting: Internal Medicine

## 2024-09-29 ENCOUNTER — Other Ambulatory Visit: Payer: Self-pay

## 2024-09-29 DIAGNOSIS — Z8249 Family history of ischemic heart disease and other diseases of the circulatory system: Secondary | ICD-10-CM

## 2024-09-29 DIAGNOSIS — Z85820 Personal history of malignant melanoma of skin: Secondary | ICD-10-CM

## 2024-09-29 DIAGNOSIS — E860 Dehydration: Secondary | ICD-10-CM | POA: Diagnosis present

## 2024-09-29 DIAGNOSIS — R531 Weakness: Secondary | ICD-10-CM

## 2024-09-29 DIAGNOSIS — Z87440 Personal history of urinary (tract) infections: Secondary | ICD-10-CM

## 2024-09-29 DIAGNOSIS — I872 Venous insufficiency (chronic) (peripheral): Secondary | ICD-10-CM | POA: Diagnosis present

## 2024-09-29 DIAGNOSIS — M47816 Spondylosis without myelopathy or radiculopathy, lumbar region: Secondary | ICD-10-CM | POA: Diagnosis present

## 2024-09-29 DIAGNOSIS — K219 Gastro-esophageal reflux disease without esophagitis: Secondary | ICD-10-CM | POA: Diagnosis present

## 2024-09-29 DIAGNOSIS — Z79899 Other long term (current) drug therapy: Secondary | ICD-10-CM

## 2024-09-29 DIAGNOSIS — B9620 Unspecified Escherichia coli [E. coli] as the cause of diseases classified elsewhere: Secondary | ICD-10-CM

## 2024-09-29 DIAGNOSIS — Z882 Allergy status to sulfonamides status: Secondary | ICD-10-CM

## 2024-09-29 DIAGNOSIS — Z87442 Personal history of urinary calculi: Secondary | ICD-10-CM

## 2024-09-29 DIAGNOSIS — G9341 Metabolic encephalopathy: Secondary | ICD-10-CM | POA: Diagnosis present

## 2024-09-29 DIAGNOSIS — G2581 Restless legs syndrome: Secondary | ICD-10-CM | POA: Diagnosis present

## 2024-09-29 DIAGNOSIS — N3000 Acute cystitis without hematuria: Secondary | ICD-10-CM | POA: Diagnosis present

## 2024-09-29 DIAGNOSIS — A419 Sepsis, unspecified organism: Principal | ICD-10-CM

## 2024-09-29 DIAGNOSIS — Z79890 Hormone replacement therapy (postmenopausal): Secondary | ICD-10-CM

## 2024-09-29 DIAGNOSIS — Z888 Allergy status to other drugs, medicaments and biological substances status: Secondary | ICD-10-CM

## 2024-09-29 DIAGNOSIS — I10 Essential (primary) hypertension: Secondary | ICD-10-CM | POA: Diagnosis present

## 2024-09-29 DIAGNOSIS — Z7982 Long term (current) use of aspirin: Secondary | ICD-10-CM

## 2024-09-29 DIAGNOSIS — A4151 Sepsis due to Escherichia coli [E. coli]: Principal | ICD-10-CM | POA: Diagnosis present

## 2024-09-29 DIAGNOSIS — Z881 Allergy status to other antibiotic agents status: Secondary | ICD-10-CM

## 2024-09-29 DIAGNOSIS — I272 Pulmonary hypertension, unspecified: Secondary | ICD-10-CM | POA: Diagnosis present

## 2024-09-29 DIAGNOSIS — Z1611 Resistance to penicillins: Secondary | ICD-10-CM | POA: Diagnosis present

## 2024-09-29 DIAGNOSIS — E039 Hypothyroidism, unspecified: Secondary | ICD-10-CM | POA: Diagnosis present

## 2024-09-29 DIAGNOSIS — A4150 Gram-negative sepsis, unspecified: Secondary | ICD-10-CM | POA: Diagnosis present

## 2024-09-29 DIAGNOSIS — J4530 Mild persistent asthma, uncomplicated: Secondary | ICD-10-CM | POA: Diagnosis present

## 2024-09-29 DIAGNOSIS — M19041 Primary osteoarthritis, right hand: Secondary | ICD-10-CM | POA: Diagnosis present

## 2024-09-29 DIAGNOSIS — G4733 Obstructive sleep apnea (adult) (pediatric): Secondary | ICD-10-CM | POA: Diagnosis present

## 2024-09-29 DIAGNOSIS — R6510 Systemic inflammatory response syndrome (SIRS) of non-infectious origin without acute organ dysfunction: Secondary | ICD-10-CM | POA: Diagnosis present

## 2024-09-29 DIAGNOSIS — M19042 Primary osteoarthritis, left hand: Secondary | ICD-10-CM | POA: Diagnosis present

## 2024-09-29 DIAGNOSIS — F32A Depression, unspecified: Secondary | ICD-10-CM | POA: Diagnosis present

## 2024-09-29 DIAGNOSIS — I878 Other specified disorders of veins: Secondary | ICD-10-CM | POA: Diagnosis present

## 2024-09-29 DIAGNOSIS — I7123 Aneurysm of the descending thoracic aorta, without rupture: Secondary | ICD-10-CM | POA: Diagnosis present

## 2024-09-29 DIAGNOSIS — J9811 Atelectasis: Secondary | ICD-10-CM | POA: Diagnosis present

## 2024-09-29 LAB — COMPREHENSIVE METABOLIC PANEL WITH GFR
ALT: 16 U/L (ref 0–44)
AST: 16 U/L (ref 15–41)
Albumin: 3.9 g/dL (ref 3.5–5.0)
Alkaline Phosphatase: 84 U/L (ref 38–126)
Anion gap: 12 (ref 5–15)
BUN: 25 mg/dL — ABNORMAL HIGH (ref 8–23)
CO2: 22 mmol/L (ref 22–32)
Calcium: 10.6 mg/dL — ABNORMAL HIGH (ref 8.9–10.3)
Chloride: 103 mmol/L (ref 98–111)
Creatinine, Ser: 1.01 mg/dL — ABNORMAL HIGH (ref 0.44–1.00)
GFR, Estimated: 56 mL/min — ABNORMAL LOW
Glucose, Bld: 105 mg/dL — ABNORMAL HIGH (ref 70–99)
Potassium: 3.8 mmol/L (ref 3.5–5.1)
Sodium: 137 mmol/L (ref 135–145)
Total Bilirubin: 0.8 mg/dL (ref 0.0–1.2)
Total Protein: 6.1 g/dL — ABNORMAL LOW (ref 6.5–8.1)

## 2024-09-29 LAB — CBC WITH DIFFERENTIAL/PLATELET
Abs Immature Granulocytes: 0.04 K/uL (ref 0.00–0.07)
Basophils Absolute: 0 K/uL (ref 0.0–0.1)
Basophils Relative: 0 %
Eosinophils Absolute: 0 K/uL (ref 0.0–0.5)
Eosinophils Relative: 0 %
HCT: 32.2 % — ABNORMAL LOW (ref 36.0–46.0)
Hemoglobin: 10.5 g/dL — ABNORMAL LOW (ref 12.0–15.0)
Immature Granulocytes: 1 %
Lymphocytes Relative: 9 %
Lymphs Abs: 0.8 K/uL (ref 0.7–4.0)
MCH: 29.9 pg (ref 26.0–34.0)
MCHC: 32.6 g/dL (ref 30.0–36.0)
MCV: 91.7 fL (ref 80.0–100.0)
Monocytes Absolute: 0.8 K/uL (ref 0.1–1.0)
Monocytes Relative: 9 %
Neutro Abs: 7.2 K/uL (ref 1.7–7.7)
Neutrophils Relative %: 81 %
Platelets: 268 K/uL (ref 150–400)
RBC: 3.51 MIL/uL — ABNORMAL LOW (ref 3.87–5.11)
RDW: 15.1 % (ref 11.5–15.5)
WBC: 8.9 K/uL (ref 4.0–10.5)
nRBC: 0 % (ref 0.0–0.2)

## 2024-09-29 LAB — URINALYSIS, W/ REFLEX TO CULTURE (INFECTION SUSPECTED)
Bilirubin Urine: NEGATIVE
Glucose, UA: NEGATIVE mg/dL
Ketones, ur: 40 mg/dL — AB
Nitrite: NEGATIVE
Protein, ur: 30 mg/dL — AB
Specific Gravity, Urine: 1.021 (ref 1.005–1.030)
WBC, UA: >50 WBC/hpf (ref 0–5)
pH: 6 (ref 5.0–8.0)

## 2024-09-29 LAB — PROTIME-INR
INR: 1 (ref 0.8–1.2)
Prothrombin Time: 14.1 s (ref 11.4–15.2)

## 2024-09-29 LAB — LACTIC ACID, PLASMA
Lactic Acid, Venous: 0.8 mmol/L (ref 0.5–1.9)
Lactic Acid, Venous: 0.7 mmol/L (ref 0.5–1.9)

## 2024-09-29 MED ORDER — LACTATED RINGERS IV SOLN: INTRAVENOUS | Status: AC

## 2024-09-29 MED ORDER — ACETAMINOPHEN 325 MG PO TABS
650.0000 mg | ORAL_TABLET | Freq: Once | ORAL | Status: AC
  Administered 2024-09-29: 650 mg via ORAL
  Filled 2024-09-29: qty 2

## 2024-09-29 MED ORDER — CIPROFLOXACIN IN D5W 400 MG/200ML IV SOLN
400.0000 mg | Freq: Two times a day (BID) | INTRAVENOUS | Status: DC
  Administered 2024-09-29: 400 mg via INTRAVENOUS
  Filled 2024-09-29: qty 200

## 2024-09-29 NOTE — ED Triage Notes (Signed)
 Pt via pov from home with husband and daughter; they report that pt had fever today; told daughter that she has had foul smelling urine for weeks. Daughter states pt did not have fever yesterday, and her confusion has been increased for the last 2 days. Pt alert & answers orientation questions appropriately. NAD noted.

## 2024-09-29 NOTE — ED Notes (Signed)
One set of cultures collected 

## 2024-09-29 NOTE — ED Provider Notes (Signed)
 " [place] EMERGENCY DEPARTMENT AT [facility] Provider Note   CSN: [id number] Arrival date & time: 09/29/24  8096    Patient presents with: Fever and Altered Mental Status   Donna Alvarez is a 78 y.o. female here for evaluation of weakness, fever, decreased p.o. intake, malodorous urine.  Family noted fever today.  Over last few days has had decreased p.o. intake, more forgetful than normal.  She is minimally ambulatory at baseline.  She has some chronic shortness of breath and chronic back pain which is unchanged.  No chest pain, vomiting, changes in bowel movements.  She has a known aneurysm.  Follows with wound care for bilateral lower extremity edema, gets Unna boots changed   HPI     Prior to Admission medications  Medication Sig Start Date End Date Taking? Authorizing Provider  amLODipine  (NORVASC ) 2.5 MG tablet Take 2.5 mg by mouth daily after breakfast.     [provider]  aspirin  (BAYER ASPIRIN ) 325 MG tablet Take 1 tablet (325 mg total) by mouth daily. Take one daily to decrease risks of blood clots for 4 wks then OK to stop 10/27/19   Barbarann Oneil BROCKS, MD  Cholecalciferol  3000 UNITS TABS Take 3,000 Units by mouth daily.    [provider]  docusate sodium  100 MG CAPS Take 100 mg by mouth 2 (two) times daily. 05/30/13   Mavis Purchase, MD  DULoxetine  (CYMBALTA ) 30 MG capsule Take 30 mg by mouth daily. 05/20/22   [provider]  furosemide  (LASIX ) 20 MG tablet Take 20 mg by mouth daily after breakfast.     [provider]  HYDROcodone -acetaminophen  (NORCO/VICODIN) 5-325 MG tablet Take 1-2 tablets by mouth every 6 (six) hours as needed for moderate pain. Post tibia nail Patient not taking: Reported on 01/06/2020 11/04/19   Barbarann Oneil BROCKS, MD  losartan (COZAAR) 25 MG tablet Take 25 mg by mouth daily. 07/01/22   [provider]  methocarbamol  (ROBAXIN ) 500 MG tablet Take 1 tablet (500 mg total) by mouth every 8 (eight) hours  as needed for muscle spasms. Patient not taking: Reported on 01/06/2020 10/27/19   Barbarann Oneil BROCKS, MD  potassium chloride  (MICRO-K ) 10 MEQ CR capsule Take 10 mEq by mouth daily.    [provider]  pramipexole  (MIRAPEX ) 1.5 MG tablet Take 1.5 mg by mouth at bedtime.     [provider]  SYNTHROID  88 MCG tablet Take 88 mcg by mouth See admin instructions. take 88mcg daily and 176 mcg on Sundays\ MUST HAVE NAME BRAND 10/01/19   [provider]    Allergies: Cephalexin, Doxycycline, Risedronate, and Sulfamethoxazole-trimethoprim    Review of Systems  Constitutional:  Positive for activity change, appetite change, fatigue and fever.  HENT: Negative.    Respiratory: Negative.    Cardiovascular: Negative.   Gastrointestinal:  Positive for nausea. Negative for constipation, diarrhea and vomiting.  Genitourinary:  Positive for dysuria and urgency.  Musculoskeletal: Negative.   Skin: Negative.   Neurological:  Positive for weakness (generalized).  All other systems reviewed and are negative.   Updated Vital Signs BP 133/68 (BP Location: Left Arm)   Pulse 92   Temp 99.5 F (37.5 C) (Oral)   Resp (!) 26   Ht 5' (1.524 m)   Wt 52.2 kg   SpO2 94%   BMI 22.48 kg/m   Physical Exam Vitals and nursing note reviewed.  Constitutional:      General: She is not in acute distress.  Appearance: She is well-developed. She is ill-appearing. She is not toxic-appearing or diaphoretic.  HENT:     Head: Normocephalic and atraumatic.  Eyes:     Pupils: Pupils are equal, round, and reactive to light.  Cardiovascular:     Rate and Rhythm: Normal rate.     Pulses: Normal pulses.          Radial pulses are 2+ on the right side and 2+ on the left side.       Dorsalis pedis pulses are 2+ on the right side and 2+ on the left side.     Heart sounds: Normal heart sounds.  Pulmonary:     Effort: Pulmonary effort is normal. No respiratory distress.     Breath sounds: Normal  breath sounds.  Abdominal:     General: Bowel sounds are normal. There is no distension.     Palpations: Abdomen is soft.     Tenderness: There is abdominal tenderness. There is no right CVA tenderness, left CVA tenderness, guarding or rebound.     Comments: Suprapubic tenderness  Musculoskeletal:        General: Normal range of motion.     Cervical back: Normal range of motion.     Comments: Chronic venous stasis skin changes to bilateral legs.  Skin tear right lateral calf.  No erythema or warmth  Skin:    General: Skin is warm and dry.     Capillary Refill: Capillary refill takes less than 2 seconds.  Neurological:     General: No focal deficit present.     Mental Status: She is alert.     Motor: Weakness present.  Psychiatric:        Mood and Affect: Mood normal.     (all labs ordered are listed, but only abnormal results are displayed) Labs Reviewed  COMPREHENSIVE METABOLIC PANEL WITH GFR - Abnormal; Notable for the following components:      Result Value   Glucose, Bld 105 (*)    BUN 25 (*)    Creatinine, Ser 1.01 (*)    Calcium 10.6 (*)    Total Protein 6.1 (*)    GFR, Estimated 56 (*)    All other components within normal limits  CBC WITH DIFFERENTIAL/PLATELET - Abnormal; Notable for the following components:   RBC 3.51 (*)    Hemoglobin 10.5 (*)    HCT 32.2 (*)    All other components within normal limits  URINALYSIS, W/ REFLEX TO CULTURE (INFECTION SUSPECTED) - Abnormal; Notable for the following components:   APPearance HAZY (*)    Hgb urine dipstick TRACE (*)    Ketones, ur 40 (*)    Protein, ur 30 (*)    Leukocytes,Ua LARGE (*)    Bacteria, UA MANY (*)    Crystals PRESENT (*)    All other components within normal limits  CULTURE, BLOOD (ROUTINE X 2)  CULTURE, BLOOD (ROUTINE X 2)  URINE CULTURE  LACTIC ACID, PLASMA  LACTIC ACID, PLASMA  PROTIME-INR    EKG: None  Radiology: DG Chest 2 View if patient is not in a treatment room. Result Date:  09/29/2024 CLINICAL DATA:  Sepsis. EXAM: CHEST - 2 VIEW COMPARISON:  Chest radiograph dated 12/24/2023 FINDINGS: Minimal left lung base atelectasis. Pneumonia is not excluded. There is slight blunting of the costophrenic angles which may represent trace bilateral pleural effusions. There is background of emphysema. No pneumothorax. Stable cardiac silhouette. Atherosclerotic calcification of the aorta. Dilatation of the descending thoracic aorta measuring up  to 5.5 cm. No acute osseous pathology. IMPRESSION: 1. Minimal left lung base atelectasis. Pneumonia is not excluded. 2. Trace bilateral pleural effusions. 3. 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 CT may provide better evaluation. Electronically Signed   By: Vanetta Chou M.D.   On: 09/29/2024 20:22     .Critical Care  Performed by: Edie Rosebud LABOR, PA-C Authorized by: Edie Rosebud LABOR, PA-C   Critical care provider statement:    Critical care time (minutes):  35   Critical care was necessary to treat or prevent imminent or life-threatening deterioration of the following conditions:  Sepsis and dehydration   Critical care was time spent personally by me on the following activities:  Development of treatment plan with patient or surrogate, discussions with consultants, evaluation of patient's response to treatment, examination of patient, ordering and review of laboratory studies, ordering and review of radiographic studies, ordering and performing treatments and interventions, pulse oximetry, re-evaluation of patient's condition and review of old charts    Medications Ordered in the ED  lactated ringers  infusion ( Intravenous New Bag/Given 09/29/24 2319)  ciprofloxacin  (CIPRO ) IVPB 400 mg (400 mg Intravenous New Bag/Given 09/29/24 2326)  acetaminophen  (TYLENOL ) tablet 650 mg (650 mg Oral Given 09/29/24 198)   78 year old multiple medical comorbidities here for evaluation of urinary symptoms and fever.  Some possible  increase confusion at home.  She has a known thoracic aortic aneurysm, prior UTI as well as bilateral lower extremity edema which she is followed by wound care.  She states her legs have been placed in Unna boots and the redness and swelling has improved.  Noted fever today.  Has had urinary symptoms over the last week or so.  Patient here with family.  On arrival she is febrile, tachycardic.  No new upper respiratory symptoms.  No new back pain, chest pain.  Will plan on labs, imaging, reassess  Labs and imaging personally viewed interpreted CBC without leukocytosis, hemoglobin 10.5-similar to prior Metabolic panel creatinine 1.01, similar to her most recent ED evaluation at outside hospital Lactic 0.7 UA positive for infection Chest x-ray with atelectasis, effusions, thoracic aneurysm  Patient and family reassessed.  She has allergy antibiotics.  Has tolerated Cipro  previously.  Will start on Cipro , gentle IV fluids given her prior pulmonary edema.  Tylenol  given as well for fever.  Patient meets sepsis criteria.  Does not want large bolus of fluids given history of pulmonary edema.  Will give gentle hydration.  No hypotension, lactic acid within normal limits.  She has a history of allergy to antibiotics.  Has tolerated Cipro  IV previously.  She has a nonfocal neuroexam.  Will plan on admission  Discussed with Dr. Franky who is agreeable for transfer for admission.  Patient and family agreeable.  The patient appears reasonably stabilized for admission considering the current resources, flow, and capabilities available in the ED at this time, and I doubt any other [facility] requiring further screening and/or treatment in the ED prior to admission.    Clinical Course as of 09/29/24 2354  Mon Sep 29, 2024  2329 Dr. Franky with medicine agreeable to set patient in transfer for admission [BH]    Clinical Course User Index [BH] Ola Fawver A, PA-C                                  Medical Decision Making Amount and/or Complexity of Data Reviewed Independent Historian: spouse  External Data Reviewed: labs, radiology, ECG and notes. Labs: ordered. Decision-making details documented in ED Course. Radiology: ordered and independent interpretation performed. Decision-making details documented in ED Course. ECG/medicine tests: ordered and independent interpretation performed. Decision-making details documented in ED Course.  Risk OTC drugs. Prescription drug management. Decision regarding hospitalization. Diagnosis or treatment significantly limited by social determinants of health.        Final diagnoses:  Sepsis without acute organ dysfunction, due to unspecified organism [facility])  Acute cystitis without hematuria  Weakness  Dehydration    ED Discharge Orders     None          Zakar Brosch A, PA-C 09/29/24 2354  "

## 2024-09-29 NOTE — ED Notes (Signed)
 BLE unwrapped from [place] boot dressings. DP pulses 2+ bilaterally, + motor and sensation. 1 small open area with scant serosanguinous drainage.

## 2024-09-30 ENCOUNTER — Encounter (HOSPITAL_BASED_OUTPATIENT_CLINIC_OR_DEPARTMENT_OTHER): Payer: Self-pay | Admitting: Internal Medicine

## 2024-09-30 ENCOUNTER — Other Ambulatory Visit: Payer: Self-pay

## 2024-09-30 DIAGNOSIS — G2581 Restless legs syndrome: Secondary | ICD-10-CM

## 2024-09-30 DIAGNOSIS — Z79899 Other long term (current) drug therapy: Secondary | ICD-10-CM | POA: Diagnosis not present

## 2024-09-30 DIAGNOSIS — N3000 Acute cystitis without hematuria: Secondary | ICD-10-CM | POA: Diagnosis present

## 2024-09-30 DIAGNOSIS — Z7982 Long term (current) use of aspirin: Secondary | ICD-10-CM | POA: Diagnosis not present

## 2024-09-30 DIAGNOSIS — E039 Hypothyroidism, unspecified: Secondary | ICD-10-CM | POA: Diagnosis present

## 2024-09-30 DIAGNOSIS — G4733 Obstructive sleep apnea (adult) (pediatric): Secondary | ICD-10-CM | POA: Diagnosis present

## 2024-09-30 DIAGNOSIS — Z1611 Resistance to penicillins: Secondary | ICD-10-CM | POA: Diagnosis present

## 2024-09-30 DIAGNOSIS — Z8249 Family history of ischemic heart disease and other diseases of the circulatory system: Secondary | ICD-10-CM | POA: Diagnosis not present

## 2024-09-30 DIAGNOSIS — I878 Other specified disorders of veins: Secondary | ICD-10-CM | POA: Diagnosis present

## 2024-09-30 DIAGNOSIS — Z87440 Personal history of urinary (tract) infections: Secondary | ICD-10-CM | POA: Diagnosis not present

## 2024-09-30 DIAGNOSIS — A4150 Gram-negative sepsis, unspecified: Secondary | ICD-10-CM | POA: Diagnosis not present

## 2024-09-30 DIAGNOSIS — I7123 Aneurysm of the descending thoracic aorta, without rupture: Secondary | ICD-10-CM | POA: Diagnosis present

## 2024-09-30 DIAGNOSIS — I10 Essential (primary) hypertension: Secondary | ICD-10-CM

## 2024-09-30 DIAGNOSIS — I872 Venous insufficiency (chronic) (peripheral): Secondary | ICD-10-CM

## 2024-09-30 DIAGNOSIS — K219 Gastro-esophageal reflux disease without esophagitis: Secondary | ICD-10-CM | POA: Diagnosis present

## 2024-09-30 DIAGNOSIS — J4530 Mild persistent asthma, uncomplicated: Secondary | ICD-10-CM

## 2024-09-30 DIAGNOSIS — E860 Dehydration: Secondary | ICD-10-CM | POA: Diagnosis present

## 2024-09-30 DIAGNOSIS — I272 Pulmonary hypertension, unspecified: Secondary | ICD-10-CM | POA: Diagnosis present

## 2024-09-30 DIAGNOSIS — F32A Depression, unspecified: Secondary | ICD-10-CM | POA: Diagnosis present

## 2024-09-30 DIAGNOSIS — I739 Peripheral vascular disease, unspecified: Secondary | ICD-10-CM | POA: Diagnosis not present

## 2024-09-30 DIAGNOSIS — R4182 Altered mental status, unspecified: Secondary | ICD-10-CM | POA: Diagnosis present

## 2024-09-30 DIAGNOSIS — R6510 Systemic inflammatory response syndrome (SIRS) of non-infectious origin without acute organ dysfunction: Secondary | ICD-10-CM | POA: Diagnosis present

## 2024-09-30 DIAGNOSIS — Z87442 Personal history of urinary calculi: Secondary | ICD-10-CM | POA: Diagnosis not present

## 2024-09-30 DIAGNOSIS — G9341 Metabolic encephalopathy: Secondary | ICD-10-CM | POA: Diagnosis present

## 2024-09-30 DIAGNOSIS — Z85820 Personal history of malignant melanoma of skin: Secondary | ICD-10-CM | POA: Diagnosis not present

## 2024-09-30 DIAGNOSIS — J9811 Atelectasis: Secondary | ICD-10-CM | POA: Diagnosis present

## 2024-09-30 DIAGNOSIS — N390 Urinary tract infection, site not specified: Secondary | ICD-10-CM | POA: Diagnosis not present

## 2024-09-30 DIAGNOSIS — A4151 Sepsis due to Escherichia coli [E. coli]: Secondary | ICD-10-CM | POA: Diagnosis present

## 2024-09-30 DIAGNOSIS — A419 Sepsis, unspecified organism: Secondary | ICD-10-CM

## 2024-09-30 DIAGNOSIS — Z79890 Hormone replacement therapy (postmenopausal): Secondary | ICD-10-CM | POA: Diagnosis not present

## 2024-09-30 MED ORDER — PREGABALIN 25 MG PO CAPS
25.0000 mg | ORAL_CAPSULE | Freq: Every day | ORAL | Status: DC
  Administered 2024-09-30 – 2024-10-01 (×2): 25 mg via ORAL
  Filled 2024-09-30 (×2): qty 1

## 2024-09-30 MED ORDER — FUROSEMIDE 20 MG PO TABS: 20.0000 mg | ORAL_TABLET | Freq: Every day | ORAL | Status: DC

## 2024-09-30 MED ORDER — HYDRALAZINE HCL 20 MG/ML IJ SOLN: 5.0000 mg | Freq: Four times a day (QID) | INTRAMUSCULAR | Status: DC | PRN

## 2024-09-30 MED ORDER — SODIUM CHLORIDE 0.9 % IV BOLUS
500.0000 mL | Freq: Once | INTRAVENOUS | Status: AC
  Administered 2024-09-30: 500 mL via INTRAVENOUS

## 2024-09-30 MED ORDER — ACETAMINOPHEN 650 MG RE SUPP: 650.0000 mg | Freq: Four times a day (QID) | RECTAL | Status: DC | PRN

## 2024-09-30 MED ORDER — ACETAMINOPHEN 325 MG PO TABS
650.0000 mg | ORAL_TABLET | Freq: Four times a day (QID) | ORAL | Status: DC | PRN
  Administered 2024-09-30 – 2024-10-02 (×3): 650 mg via ORAL
  Filled 2024-09-30 (×3): qty 2

## 2024-09-30 MED ORDER — ONDANSETRON HCL 4 MG PO TABS: 4.0000 mg | ORAL_TABLET | Freq: Four times a day (QID) | ORAL | Status: DC | PRN

## 2024-09-30 MED ORDER — ENOXAPARIN SODIUM 40 MG/0.4ML IJ SOSY
40.0000 mg | PREFILLED_SYRINGE | INTRAMUSCULAR | Status: DC
  Administered 2024-09-30 – 2024-10-01 (×2): 40 mg via SUBCUTANEOUS
  Filled 2024-09-30 (×2): qty 0.4

## 2024-09-30 MED ORDER — CIPROFLOXACIN IN D5W 400 MG/200ML IV SOLN: 400.0000 mg | Freq: Two times a day (BID) | INTRAVENOUS | Status: DC

## 2024-09-30 MED ORDER — TRAMADOL HCL 50 MG PO TABS
50.0000 mg | ORAL_TABLET | Freq: Four times a day (QID) | ORAL | Status: DC | PRN
  Administered 2024-09-30: 50 mg via ORAL
  Filled 2024-09-30: qty 1

## 2024-09-30 MED ORDER — LEVOTHYROXINE SODIUM 88 MCG PO TABS
88.0000 ug | ORAL_TABLET | Freq: Every day | ORAL | Status: DC
  Administered 2024-10-01 – 2024-10-02 (×2): 88 ug via ORAL
  Filled 2024-09-30 (×2): qty 1

## 2024-09-30 MED ORDER — DULOXETINE HCL 30 MG PO CPEP
30.0000 mg | ORAL_CAPSULE | Freq: Every day | ORAL | Status: DC
  Administered 2024-09-30: 30 mg via ORAL
  Filled 2024-09-30: qty 1

## 2024-09-30 MED ORDER — PRAMIPEXOLE DIHYDROCHLORIDE 1 MG PO TABS
1.5000 mg | ORAL_TABLET | Freq: Every day | ORAL | Status: DC
  Administered 2024-09-30 – 2024-10-01 (×2): 1.5 mg via ORAL
  Filled 2024-09-30 (×2): qty 2

## 2024-09-30 MED ORDER — ORAL CARE MOUTH RINSE: 15.0000 mL | OROMUCOSAL | Status: DC | PRN

## 2024-09-30 MED ORDER — MELATONIN 5 MG PO TABS: 5.0000 mg | ORAL_TABLET | Freq: Every evening | ORAL | Status: DC | PRN

## 2024-09-30 MED ORDER — GABAPENTIN 100 MG PO CAPS
100.0000 mg | ORAL_CAPSULE | Freq: Once | ORAL | Status: AC
  Administered 2024-09-30: 100 mg via ORAL
  Filled 2024-09-30: qty 1

## 2024-09-30 MED ORDER — FUROSEMIDE 20 MG PO TABS
20.0000 mg | ORAL_TABLET | Freq: Every day | ORAL | Status: DC
  Administered 2024-10-01 – 2024-10-02 (×2): 20 mg via ORAL
  Filled 2024-09-30 (×2): qty 1

## 2024-09-30 MED ORDER — ONDANSETRON HCL 4 MG/2ML IJ SOLN: 4.0000 mg | Freq: Four times a day (QID) | INTRAMUSCULAR | Status: DC | PRN

## 2024-09-30 MED ORDER — SODIUM CHLORIDE 0.9 % IV SOLN
2.0000 g | INTRAVENOUS | Status: DC
  Administered 2024-09-30 – 2024-10-02 (×3): 2 g via INTRAVENOUS
  Filled 2024-09-30 (×3): qty 20

## 2024-09-30 MED ORDER — ACETAMINOPHEN 325 MG PO TABS
650.0000 mg | ORAL_TABLET | Freq: Four times a day (QID) | ORAL | Status: DC | PRN
  Administered 2024-09-30: 650 mg via ORAL
  Filled 2024-09-30: qty 2

## 2024-09-30 MED ORDER — ALBUTEROL SULFATE (2.5 MG/3ML) 0.083% IN NEBU: 2.5000 mg | INHALATION_SOLUTION | RESPIRATORY_TRACT | Status: DC | PRN

## 2024-09-30 NOTE — Assessment & Plan Note (Addendum)
 09/30/2024 Home furosemide  20 mg daily with breakfast not resumed in setting of patient getting IVF for possible sepsis Hydralazine  5 mg IV every 6 hours.  For SBP greater than 170, 5 disorder  Furosemide  20 mg daily resumed for 12/24  10/01/2024 back on lasix  today.

## 2024-09-30 NOTE — Hospital Course (Addendum)
 CC: AMS, fever HPI:  Ms. Donna Alvarez is a 78 year old female with history of hypertension, depression, venous stasis, hypothyroid, restless leg syndrome, pulmonary hypertension who does not tolerate CPAP therapy.   09/29/24: she presents to the ED for chief concerns of fever and altered mental status for 2 days.   Vitals at the time of my evaluation showed T of 98.4, rr 26, hr 99, blood pressure 144/69, SpO2 91% on room air. Tech checking vitals showed T of 100.9.   Serum sodium is 137, potassium 3.8, chloride 103, bicarb 22, BUN of 25, serum creatinine of 1.01, eGFR 56, nonfasting blood glucose 105, WBC 8.9, hemoglobin 10.5, platelets of 268.   Lactic acid was 0.8, repeat is 2.7.   ED treatment: Ciprofloxacin  400 mg one-time dose, LR 75 mL/h initiated, acetaminophen  650 mg p.o. one-time dose.  09/29/24: she presents to the ED for chief concerns of fever and altered mental status for 2 days.   09/30/2024: Patient admitted to [facility] hospitalist service for chief concerns of complicated urinary tract infection, meeting sepsis criteria. --------------------------------- At bedside, via virtual encounter, patient was able to confirm her first and last name, her date of birth, the current calendar year, the current location.   Patient reports over the last week, she has noticed odorous urine that is abnormal for her.  Yesterday, patient had a fever at home of 22 F.  Her family stated that she was not acting right for the last 2 days and therefore they brought her to the emergency department.   Her daughter just left this morning and knows that she is being admitted to the hospital.   She denies chest pain, shortness of breath, dysuria, hematuria, diarrhea, nausea, vomiting.   She reports she has baseline shortness of breath worse with exertion and going uphill that has been ongoing for over a year.  Her primary care provider is aware of this.  Significant Events: Admitted 09/29/2024  Patient admitted to [facility] hospitalist service for chief concerns of complicated urinary tract infection, meeting sepsis criteria.   Admission Labs: WBC 8.9, HgB 10.5, plt 268 INR 1.0 Lactic acid 0.8 Na 137, K 3.8, CO2 of 22, BUN 25, Scr 1.01, glu 105, Ca 10.5 T. Prot 6.1, Alb 3.9, AST 16, ALT 16, alk phos 84, t. Bili 0.8 UA hazy appearance, large LE, negative nitrite, WBC >50, many bacteria  Admission Imaging Studies:   Significant Labs: Urine cx. Prelim E. Coli. Final sensitivities pending  Significant Imaging Studies: ABI show normal bilateral indexes  Antibiotic Therapy: Anti-infectives (From admission, onward)    Start     Dose/Rate Route Frequency Ordered Stop   09/30/24 1000  cefTRIAXone  (ROCEPHIN ) 2 g in sodium chloride  0.9 % 100 mL IVPB        2 g 200 mL/hr over 30 Minutes Intravenous Every 24 hours 09/30/24 0908 10/04/24 0959   09/30/24 0900  ciprofloxacin  (CIPRO ) IVPB 400 mg  Status:  Discontinued        400 mg 200 mL/hr over 60 Minutes Intravenous Every 12 hours 09/30/24 0850 09/30/24 0908   09/29/24 2300  ciprofloxacin  (CIPRO ) IVPB 400 mg  Status:  Discontinued        400 mg 200 mL/hr over 60 Minutes Intravenous Every 12 hours 09/29/24 2254 09/30/24 0850       Procedures:   Consultants:

## 2024-09-30 NOTE — Care Management (Signed)
 Patient seen and examined. Reviewed H/P and hospitalization plan with the patient . She looks stable. Started on IV antibiotics. Home meds were resumed . She needed some pain meds for leg pain, taking tramadol  in the past- resumed.

## 2024-09-30 NOTE — Progress Notes (Signed)
 Orthopedic Tech Progress Note Patient Details:  Donna Alvarez Mar 23, 1946 [id number]  Ortho Devices Type of Ortho Device: Nonie boot Ortho Device/Splint Location: bi lat unna boot applied Ortho Device/Splint Interventions: Ordered, Application, Adjustment   Post Interventions Patient Tolerated: Well Instructions Provided: Adjustment of device, Care of device  Waylan Thom Loving 09/30/2024, 3:19 PM

## 2024-09-30 NOTE — ED Notes (Signed)
 Called [facility] to transport the patient to [facility] rm# [phone]

## 2024-09-30 NOTE — Plan of Care (Signed)
   Problem: Education: Goal: Knowledge of General Education information will improve Description: Including pain rating scale, medication(s)/side effects and non-pharmacologic comfort measures Outcome: Completed/Met

## 2024-09-30 NOTE — Assessment & Plan Note (Addendum)
 09/30/2024 Home duloxetine  30 mg daily resumed

## 2024-09-30 NOTE — Assessment & Plan Note (Addendum)
 12.-23-2025 Patient meets sepsis criteria with elevated respiration rate, temperature of 100.9, source of infection is urine, and altered mentation at home Blood cultures x 2 are in process Urine cultures in process Lactic acid is reassuringly not elevated NS 500 mL liter bolus ordered on admission LR infusion at 125 mL/h initiated Maintain MAP > 65 Ceftriaxone  2 g IV daily to complete a 5-day course Present on admission  10-01-2024 still  had fever of 101.9 last night (09-30-2024) @ 2116. Continue to monitor. WBC today is 5.9

## 2024-09-30 NOTE — Assessment & Plan Note (Addendum)
 09/30/2024 Home pregabalin  25 mg nightly, pramipexole  1.5 mg nightly were resumed  10/01/2024 continue with requip and lyrica .

## 2024-09-30 NOTE — Assessment & Plan Note (Addendum)
 09/30/2024 unna wrap weekly. Wound care consulted. See pictures. right leg Left leg      10/01/2024 pt states Unna boots placed yesterday. Will need skin to be re-evaluated prior to discharge from [facility]. ABI show normal indexes.

## 2024-09-30 NOTE — ED Notes (Signed)
 Patient with hx of sleep apnea. Per patient, she does not utilize a CPAP at home.

## 2024-09-30 NOTE — Consult Note (Signed)
 WOC Nurse Consult Note:  WOC consult performed remotely utilizing imaging and chart review Reason for Consult:venous stasis of lower extremity, unna boot weekly  Wound type: LLE is edematous with no wound present, RLE edematous with two darkened areas of tissue in the superior and lateral aspect of leg, not an open wound but appearance suggestive of a healing skin tear  Pressure Injury POA: NA- not pressure Measurement: see nursing flow sheets Wound bed: see above Drainage (amount, consistency, odor) see nursing flow sheets Periwound: intact Dressing procedure/placement/frequency:  Cleanse RLE with NS, pat dry.  Apply Xeroform to wound bed and cover with silicone foam dressing.  Change dressing with every Unna boot change/application.  Maintain unna boots to bilateral LE, to be applied by ortho tech.       WOC team will not follow patient at this time, please re consult if new needs arise or wound deteriorates.  Thank you,  Doyal Polite, MSN, RN, [facility] WOC Team [phone] (Available Mon-Fri 0700-1500)

## 2024-09-30 NOTE — H&P (Addendum)
 " History and Physical - Telemedicine  [name] FMW:[id number] DOB: Feb 07, 1946 DOA: 09/29/2024  PCP: Billy Corean PARAS, MD  Outpatient Specialists: Dr. Delaney, dermatology Patient coming from: home  Referring provider: Rosebud Fass, PA EDP Telemedicine provider: Dr. Sherre  Patient location: [place] EM at [facility] Referring diagnosis: UTI Patient name and DOB verified: Patient was able to verify her first and last name: Donna Alvarez, date of birth: [birth year]. Patient consented to Telemedicine Evaluation: yes RN virtual assistant: Corean Cleveland, RN Video encounter time and date: 09/30/2024 and at approximately: 08:19a  Chief Concern: altered mental status, fever  HPI: Ms. Donna Alvarez is a 78 year old female with history of hypertension, depression, venous stasis, hypothyroid, restless leg syndrome, pulmonary hypertension who does not tolerate CPAP therapy.  09/29/24: she presents to the ED for chief concerns of fever and altered mental status for 2 days.  Vitals at the time of my evaluation showed T of 98.4, rr 26, hr 99, blood pressure 144/69, SpO2 91% on room air. Tech checking vitals showed T of 100.9.  Serum sodium is 137, potassium 3.8, chloride 103, bicarb 22, BUN of 25, serum creatinine of 1.01, eGFR 56, nonfasting blood glucose 105, WBC 8.9, hemoglobin 10.5, platelets of 268.  Lactic acid was 0.8, repeat is 2.7.  ED treatment: Ciprofloxacin  400 mg one-time dose, LR 75 mL/h initiated, acetaminophen  650 mg p.o. one-time dose.  09/30/2024: [facility] admitted [facility] for chief concerns of complicated urinary tract infection, meeting sepsis criteria. --------------------------------- At bedside, via virtual encounter, patient was able to confirm her first and last name, her date of birth, the current calendar year, the current location.  Patient reports over the last week, she has noticed odorous urine that is  abnormal for her.  Yesterday, patient had a fever at home of 19 F.  Her family stated that she was not acting right for the last 2 days and therefore they brought her to the emergency department.  Her daughter just left this morning and knows that she is being admitted to the hospital.  She denies chest pain, shortness of breath, dysuria, hematuria, diarrhea, nausea, vomiting.  She reports she has baseline shortness of breath worse with exertion and going uphill that has been ongoing for over a year.  Her primary care provider is aware of this.  Social history: Patient lives at home with her husband.  She denies tobacco, EtOH, recreational drug use.  She is retired and previously worked as a designer, industrial/product.  ROS: Constitutional: no weight change, + fever ENT/Mouth: no sore throat, no rhinorrhea Eyes: no eye pain, no vision changes Cardiovascular: no chest pain, no dyspnea,  no edema, no palpitations Respiratory: no cough, no sputum, no wheezing Gastrointestinal: no nausea, no vomiting, no diarrhea, no constipation Genitourinary: no urinary incontinence, no dysuria, no hematuria Musculoskeletal: no arthralgias, no myalgias Skin: no skin lesions, no pruritus, Neuro: + weakness, no loss of consciousness, no syncope Psych: no anxiety, no depression, + decrease appetite Heme/Lymph: no bruising, no bleeding  Assessment/Plan  Principal Problem:   Sepsis (HCC) Active Problems:   SIRS (systemic inflammatory response syndrome) (HCC)   Depression   Essential hypertension   Restless leg syndrome   Hypothyroidism   OSA (obstructive sleep apnea)   Mild persistent asthma   Venous insufficiency (chronic) (peripheral)   Assessment and Plan:  * Sepsis (HCC) Patient meets sepsis criteria with elevated respiration rate, temperature of 100.9, source of infection is urine, and altered mentation at home Blood  cultures x 2 are in process Urine cultures in process Lactic acid is reassuringly  not elevated NS 500 mL liter bolus ordered on admission LR infusion at 125 mL/h initiated Maintain MAP > 65 Ceftriaxone  2 g IV daily to complete a 5-day course  Depression Home duloxetine  30 mg daily resumed  Hypothyroidism Home levothyroxine , per home dosing resumed  Restless leg syndrome Home pregabalin  25 mg nightly, pramipexole  1.5 mg nightly were resumed  Essential hypertension Home furosemide  20 mg daily with breakfast not resumed in setting of patient getting IVF for possible sepsis Hydralazine  5 mg IV every 6 hours.  For SBP greater than 170, 5 disorder  Furosemide  20 mg daily resumed for 12/24  Venous insufficiency (chronic) (peripheral) Unna wrap weekly Wound care consulted  Chart reviewed.   DVT prophylaxis: Enoxaparin  Code Status: Full code Diet: Heart healthy Family Communication: attempted to call spouse at home phone number and mobile number, no pick up.  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 procedure    Complication of anesthesia    with anesth. 10/2010- very emotional & slow to wake up, 12/2010- no problems   Dysrhythmia    Hernia    Hypertension    Hypothyroidism    Neuromuscular disorder (HCC)    uses Mirapex  for RLS   Thyroid disease    Past Surgical History:  Procedure Laterality Date   childbirth     x2    CYSTOSCOPY     for stone removal    HERNIA REPAIR     12/2010- both inguinal & umbilical done at same time   KNEE ARTHROSCOPY  10/2010   L   LUMBAR LAMINECTOMY/DECOMPRESSION MICRODISCECTOMY Right 12/25/2012   Procedure: LUMBAR LAMINECTOMY/DECOMPRESSION MICRODISCECTOMY 1 LEVEL;  Surgeon: Reyes JONETTA Budge, MD;  Location: [facility];  Service:  Neurosurgery;  Laterality: Right;  RIGHT Lumbar five sacral one diskectomy   LUMBAR LAMINECTOMY/DECOMPRESSION MICRODISCECTOMY Right 05/29/2013   Procedure: LUMBAR LAMINECTOMY/DECOMPRESSION MICRODISCECTOMY 1 LEVEL,RIGHT LUMBAR FIVE-SACRAL ONE;  Surgeon: Reyes JONETTA Budge, MD;  Location: [facility];  Service: Neurosurgery;  Laterality: Right;  RIGHT   MELANOMA EXCISION     posterior area of R leg.   TIBIA IM NAIL INSERTION Right 10/26/2019   Procedure: INTRAMEDULLARY (IM) NAIL TIBIAL;  Surgeon: Barbarann Oneil BROCKS, MD;  Location: [facility];  Service: Orthopedics;  Laterality: Right;   TORN CARTILAGE  2012   Social History:  reports that she has never smoked. She has never used smokeless tobacco. She reports that she does not drink alcohol and does not use drugs.  Allergies[1] Family History  Problem Relation Age of Onset   Cancer Father    Hypertension Father    Family history: Family history reviewed and not pertinent.  Prior to Admission medications  Medication Sig Start Date End Date Taking? Authorizing Provider  amLODipine  (NORVASC ) 2.5 MG tablet Take 2.5 mg by mouth daily after breakfast.     [provider]  aspirin  (BAYER ASPIRIN ) 325 MG tablet Take 1 tablet (325 mg total) by mouth daily. Take one daily to decrease risks of blood clots for 4 wks then OK to stop 10/27/19   Barbarann Oneil BROCKS, MD  Cholecalciferol   3000 UNITS TABS Take 3,000 Units by mouth daily.    [provider]  docusate sodium  100 MG CAPS Take 100 mg by mouth 2 (two) times daily. 05/30/13   Mavis Purchase, MD  DULoxetine  (CYMBALTA ) 30 MG capsule Take 30 mg by mouth daily. 05/20/22   [provider]  furosemide  (LASIX ) 20 MG tablet Take 20 mg by mouth daily after breakfast.     [provider]  HYDROcodone -acetaminophen  (NORCO/VICODIN) 5-325 MG tablet Take 1-2 tablets by mouth every 6 (six) hours as needed for moderate pain. Post tibia nail Patient not taking: Reported on 01/06/2020 11/04/19    Barbarann Oneil BROCKS, MD  losartan (COZAAR) 25 MG tablet Take 25 mg by mouth daily. 07/01/22   [provider]  methocarbamol  (ROBAXIN ) 500 MG tablet Take 1 tablet (500 mg total) by mouth every 8 (eight) hours as needed for muscle spasms. Patient not taking: Reported on 01/06/2020 10/27/19   Barbarann Oneil BROCKS, MD  potassium chloride  (MICRO-K ) 10 MEQ CR capsule Take 10 mEq by mouth daily.    [provider]  pramipexole  (MIRAPEX ) 1.5 MG tablet Take 1.5 mg by mouth at bedtime.     [provider]  SYNTHROID  88 MCG tablet Take 88 mcg by mouth See admin instructions. take 88mcg daily and 176 mcg on Sundays\ MUST HAVE NAME BRAND 10/01/19   [provider]   Physical Exam completed with assistance of: Corean Cleveland, RN, who was at bedside during this portion of the virtual encounter:  Vitals:   09/30/24 0557 09/30/24 0827 09/30/24 0900 09/30/24 1035  BP: 134/73 (!) 150/78 (!) 145/71 (!) 123/59  Pulse: 97 100 99 (!) 101  Resp: (!) 24 (!) 28 (!) 22 18  Temp:  (!) 100.9 F (38.3 C)  99.5 F (37.5 C)  TempSrc:  Oral    SpO2: 95% 93% 93% 97%  Weight:      Height:       Constitutional: appears frail, age-appropriate Eyes: EOMI,  conjunctivae normal ENMT: Mucous membranes are moist. Hearing appropriate Neck: normal, supple, no masses, no thyromegaly Respiratory: clear to auscultation bilaterally, no wheezing. Normal respiratory effort. No accessory muscle use.  Cardiovascular: Regular rate and rhythm, no murmurs. + B/L lower extremity edema with unna boot in place.  Capillary refill < 2 seconds Abdomen: no tenderness. Bowel sounds positive.  Musculoskeletal: No joint deformity upper and lower extremities. Good ROM, no contractures, no atrophy. Skin: no rashes, ulcers on visible skin Neurologic: Strength is appropriate upper extremities.  Psychiatric: Normal judgment and insight. Alert and oriented x 3. Normal mood.   EKG: independently reviewed, showing sinus rhythm  with rate of 92, QTc 423  Chest x-ray on Admission: I personally reviewed and I agree with radiologist reading as below.  DG Chest 2 View if patient is not in a treatment room. Result Date: 09/29/2024 CLINICAL DATA:  Sepsis. EXAM: CHEST - 2 VIEW COMPARISON:  Chest radiograph dated 12/24/2023 FINDINGS: Minimal left lung base atelectasis. Pneumonia is not excluded. There is slight blunting of the costophrenic angles which may represent trace bilateral pleural effusions. There is background of emphysema. No pneumothorax. Stable cardiac silhouette. Atherosclerotic calcification of the aorta. Dilatation of the descending thoracic aorta measuring up to 5.5 cm. No acute osseous pathology. IMPRESSION: 1. Minimal left lung base atelectasis. Pneumonia is not excluded. 2. Trace bilateral pleural effusions. 3. 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 CT may provide better evaluation. Electronically Signed   By: Vanetta Shelia HERO.D.  On: 09/29/2024 20:22   Labs on Admission: I have personally reviewed following labs.  CBC: Recent Labs  Lab 09/29/24 2100  WBC 8.9  NEUTROABS 7.2  HGB 10.5*  HCT 32.2*  MCV 91.7  PLT 268   Basic Metabolic Panel: Recent Labs  Lab 09/29/24 2100  NA 137  K 3.8  CL 103  CO2 22  GLUCOSE 105*  BUN 25*  CREATININE 1.01*  CALCIUM 10.6*   GFR: Estimated Creatinine Clearance: 31.9 mL/min (A) (by C-G formula based on SCr of 1.01 mg/dL (H)).  Liver Function Tests: Recent Labs  Lab 09/29/24 2100  AST 16  ALT 16  ALKPHOS 84  BILITOT 0.8  PROT 6.1*  ALBUMIN 3.9   Coagulation Profile: Recent Labs  Lab 09/29/24 2100  INR 1.0   Urine analysis:    Component Value Date/Time   COLORURINE YELLOW 09/29/2024 2050   APPEARANCEUR HAZY (A) 09/29/2024 2050   LABSPEC 1.021 09/29/2024 2050   PHURINE 6.0 09/29/2024 2050   GLUCOSEU NEGATIVE 09/29/2024 2050   HGBUR TRACE (A) 09/29/2024 2050   BILIRUBINUR NEGATIVE 09/29/2024 2050   KETONESUR 40 (A)  09/29/2024 2050   PROTEINUR 30 (A) 09/29/2024 2050   NITRITE NEGATIVE 09/29/2024 2050   LEUKOCYTESUR LARGE (A) 09/29/2024 2050   CRITICAL CARE Performed by: Dr. Sherre  Total critical care time: 32 minutes  Critical care time was exclusive of separately billable procedures and treating other patients.  Critical care was necessary to treat or prevent imminent or life-threatening deterioration.  Critical care was time spent personally by me on the following activities: development of treatment plan with patient as well as nursing, discussions with consultants, evaluation of patient's response to treatment, examination of patient, obtaining history from patient or surrogate, ordering and performing treatments and interventions, ordering and review of laboratory studies, ordering and review of radiographic studies, pulse oximetry and re-evaluation of patient's condition.  This document was prepared using Dragon Voice Recognition software and may include unintentional dictation errors.  Dr. Sherre [facility] Location: [place]  If 7PM-7AM, please contact overnight-coverage provider If 7AM-7PM, please contact day attending provider [email or web address]  09/30/2024, 11:38 AM      [1]  Allergies Allergen Reactions   Cephalexin Hives   Doxycycline Rash    Blisters to toes and hands Blisters to toes and hands    Risedronate Other (See Comments)    Bone pain Bone pain    Sulfamethoxazole-Trimethoprim Other (See Comments)   "

## 2024-09-30 NOTE — Assessment & Plan Note (Addendum)
 09/30/2024 Home levothyroxine , per home dosing resumed  10/01/2024 continue with 88 mcg synthroid .

## 2024-10-01 ENCOUNTER — Encounter (HOSPITAL_COMMUNITY): Payer: Self-pay | Admitting: Internal Medicine

## 2024-10-01 ENCOUNTER — Inpatient Hospital Stay (HOSPITAL_COMMUNITY)

## 2024-10-01 DIAGNOSIS — A4150 Gram-negative sepsis, unspecified: Secondary | ICD-10-CM

## 2024-10-01 DIAGNOSIS — N390 Urinary tract infection, site not specified: Secondary | ICD-10-CM | POA: Diagnosis not present

## 2024-10-01 DIAGNOSIS — G9341 Metabolic encephalopathy: Secondary | ICD-10-CM

## 2024-10-01 DIAGNOSIS — B9620 Unspecified Escherichia coli [E. coli] as the cause of diseases classified elsewhere: Secondary | ICD-10-CM

## 2024-10-01 DIAGNOSIS — I739 Peripheral vascular disease, unspecified: Secondary | ICD-10-CM | POA: Diagnosis not present

## 2024-10-01 LAB — BASIC METABOLIC PANEL WITH GFR
Anion gap: 8 (ref 5–15)
BUN: 19 mg/dL (ref 8–23)
CO2: 24 mmol/L (ref 22–32)
Calcium: 9.4 mg/dL (ref 8.9–10.3)
Chloride: 105 mmol/L (ref 98–111)
Creatinine, Ser: 0.82 mg/dL (ref 0.44–1.00)
GFR, Estimated: >60 mL/min
Glucose, Bld: 96 mg/dL (ref 70–99)
Potassium: 3.9 mmol/L (ref 3.5–5.1)
Sodium: 137 mmol/L (ref 135–145)

## 2024-10-01 LAB — CBC
HCT: 31.3 % — ABNORMAL LOW (ref 36.0–46.0)
Hemoglobin: 9.9 g/dL — ABNORMAL LOW (ref 12.0–15.0)
MCH: 29.4 pg (ref 26.0–34.0)
MCHC: 31.6 g/dL (ref 30.0–36.0)
MCV: 92.9 fL (ref 80.0–100.0)
Platelets: 234 K/uL (ref 150–400)
RBC: 3.37 MIL/uL — ABNORMAL LOW (ref 3.87–5.11)
RDW: 15 % (ref 11.5–15.5)
WBC: 5.9 K/uL (ref 4.0–10.5)
nRBC: 0 % (ref 0.0–0.2)

## 2024-10-01 LAB — VAS US ABI WITH/WO TBI
Left ABI: 1.16
Right ABI: 1.13

## 2024-10-01 MED ORDER — PANTOPRAZOLE SODIUM 40 MG PO TBEC
40.0000 mg | DELAYED_RELEASE_TABLET | Freq: Every day | ORAL | Status: DC
  Administered 2024-10-01 – 2024-10-02 (×2): 40 mg via ORAL
  Filled 2024-10-01 (×2): qty 1

## 2024-10-01 MED ORDER — PREGABALIN 25 MG PO CAPS: 25.0000 mg | ORAL_CAPSULE | Freq: Every day | ORAL | 0 refills | Status: AC

## 2024-10-01 MED ORDER — IPRATROPIUM-ALBUTEROL 0.5-2.5 (3) MG/3ML IN SOLN
3.0000 mL | RESPIRATORY_TRACT | Status: AC
  Administered 2024-10-01: 3 mL via RESPIRATORY_TRACT
  Filled 2024-10-01: qty 3

## 2024-10-01 MED ORDER — IPRATROPIUM-ALBUTEROL 0.5-2.5 (3) MG/3ML IN SOLN: 3.0000 mL | Freq: Four times a day (QID) | RESPIRATORY_TRACT | Status: DC | PRN

## 2024-10-01 MED ORDER — FLUTICASONE FUROATE-VILANTEROL 100-25 MCG/ACT IN AEPB
1.0000 | INHALATION_SPRAY | Freq: Every day | RESPIRATORY_TRACT | Status: DC
  Administered 2024-10-01 – 2024-10-02 (×2): 1 via RESPIRATORY_TRACT
  Filled 2024-10-01: qty 28

## 2024-10-01 NOTE — Progress Notes (Signed)
 Ankle brachial index has been completed.  Results can be found in chart review under CV Proc.  10/01/2024 10:34 AM  Merideth Bosque [employer]en [employer], RVT.

## 2024-10-01 NOTE — Progress Notes (Signed)
 " PROGRESS NOTE    Donna Alvarez  FMW:[id number] DOB: 08-25-1946 DOA: 09/29/2024 PCP: Billy Corean PARAS, MD    Brief Narrative:   Donna Alvarez is a 78 y.o. female with past medical history significant for HTN, descending thoracic aortic aneurysm, depression, hypothyroidism, RLS, chronic venous stasis, pulmonary hypertension intolerant of CPAP who presented to [facility] on 09/29/2024 from home with complaints of fever and confusion ongoing over the last 2 days.  Additionally patient with decreased oral intake.  At baseline minimally ambulatory.  Follows with wound care outpatient for bilateral lower extremity edema in which she has Unna boots in place which her husband changes.  In the ED, temperature 101.9 F, HR 100, RR 23, BP 124/67, SpO2 96% on room air.  WBC 8.9, hemoglobin 10.5, platelet count 268.  Sodium 137, potassium 3.8, chloride 103, CO2 22, glucose 105, BUN 25, creat 1.01.  AST 16, ALT 16, total bilirubin 0.8.  Lactic acid 0.8.  Urinalysis with large leukocytes, negative nitrite, many bacteria, greater than 50 WBCs.  Chest x-ray with trace bilateral pleural effusions, 5.5 cm distal descending thoracic aortic aneurysm.  Blood cultures and urine culture obtained.  Assessment & Plan:   Sepsis, POA E. coli urinary tract infection Patient presenting to ED with 2-day history of fever and confusion.  Temperature 101.9 F on admission, tachycardic, tachypneic with mild encephalopathy.  WBC count 8.9, lactic acid 0.8.  Urinalysis consistent with UTI. -- Urine culture: + >100K E. coli, awaiting susceptibilities -- Blood cultures x 2: no growth x 1 day -- Ceftriaxone  2 g IV every 24 hours -- Supportive care, monitor fever curve  HTN -- Lasix  20 mg p.o. daily  History of descending thoracic aortic aneurysm Follows with [facility]oth[facility].  Denies any chest pain, no abdominal pain, no back pain.  Continue  outpatient follow-up, annual surveillance.  Hypothyroidism -- Levothyroxine  88 mcg p.o. daily  GERD -- Protonix  40 mg p.o. daily  Restless leg syndrome -- Pramipexole  1.5 mg p.o. nightly  Chronic venous stasis Follows with wound care center outpatient.  Does biweekly Unna boot changes that is performed by her husband at home.  ABIs within normal limits. -- Seen by wound care RN -- Cleanse RLE with NS, pat dry.  Apply Xeroform to wound bed and cover with silicone foam dressing.  Change dressing with every Unna boot change/application. -- Maintain unna boots to bilateral LE, to be applied by ortho tech.  Pulmonary hypertension Intolerant of CPAP     DVT prophylaxis: enoxaparin  (LOVENOX ) injection 40 mg Start: 09/30/24 2200 Place TED hose Start: 09/30/24 0839    Code Status: Full Code Family Communication: Updated daughter via telephone this afternoon  Disposition Plan:  Level of care: Telemetry Status is: Inpatient Remains inpatient appropriate because: IV Novox, awaiting urine culture susceptibilities    Consultants:  None  Procedures:  Ultrasound ABI  Antimicrobials:  Ceftriaxone    Subjective: Patient seen examined bedside, lying in bed.  No specific complaints this morning.  Wants to know when she can go home.  Discussed need to await urine culture susceptibilities as well as being afebrile for greater than 24 hours.  No other questions or concerns at this time.  Denies headache, no dizziness, no chest pain, no palpitations, no shortness of breath, no abdominal pain, no fever/chills/night sweats, no nausea/vomiting/diarrhea, no focal weakness, no fatigue, no paresthesias.  No acute events overnight per nursing staff.  Objective: Vitals:   09/30/24 2219  09/30/24 2300 10/01/24 0536 10/01/24 1241  BP:  115/63 121/72   Pulse:  90 81   Resp:  20 20   Temp: 99.9 F (37.7 C) 99.3 F (37.4 C) 98.8 F (37.1 C)   TempSrc:   Oral   SpO2:  93% 96% 96%  Weight:       Height:        Intake/Output Summary (Last 24 hours) at 10/01/2024 1257 Last data filed at 09/30/2024 8177 Gross per 24 hour  Intake 1010.27 ml  Output --  Net 1010.27 ml   Filed Weights   09/29/24 1941  Weight: 52.2 kg    Examination:  Physical Exam: GEN: NAD, alert and oriented x 3, elderly in appearance HEENT: NCAT, PERRL, EOMI, sclera clear, MMM PULM: CTAB w/o wheezes/crackles, normal respiratory effort on room air CV: RRR w/o M/G/R GI: abd soft, NTND, NABS, no R/G/M MSK: no peripheral edema, moves all extremities independently with preserved muscle strength, Unna boots in place NEURO: No focal neurological deficit PSYCH: normal mood/affect Integumentary: Chronic venous changes bilateral extremities as depicted below, otherwise no other concerning rashes/lesions/wounds nonexposed skin surfaces         Data Reviewed: I have personally reviewed following labs and imaging studies  CBC: Recent Labs  Lab 09/29/24 2100 10/01/24 0513  WBC 8.9 5.9  NEUTROABS 7.2  --   HGB 10.5* 9.9*  HCT 32.2* 31.3*  MCV 91.7 92.9  PLT 268 234   Basic Metabolic Panel: Recent Labs  Lab 09/29/24 2100 10/01/24 0513  NA 137 137  K 3.8 3.9  CL 103 105  CO2 22 24  GLUCOSE 105* 96  BUN 25* 19  CREATININE 1.01* 0.82  CALCIUM 10.6* 9.4   GFR: Estimated Creatinine Clearance: 39.3 mL/min (by C-G formula based on SCr of 0.82 mg/dL). Liver Function Tests: Recent Labs  Lab 09/29/24 2100  AST 16  ALT 16  ALKPHOS 84  BILITOT 0.8  PROT 6.1*  ALBUMIN 3.9   No results for input(s): LIPASE, AMYLASE in the last 168 hours. No results for input(s): AMMONIA in the last 168 hours. Coagulation Profile: Recent Labs  Lab 09/29/24 2100  INR 1.0   Cardiac Enzymes: No results for input(s): CKTOTAL, CKMB, CKMBINDEX, TROPONINI in the last 168 hours. BNP (last 3 results) No results for input(s): PROBNP in the last 8760 hours. HbA1C: No results for input(s):  HGBA1C in the last 72 hours. CBG: No results for input(s): GLUCAP in the last 168 hours. Lipid Profile: No results for input(s): CHOL, HDL, LDLCALC, TRIG, CHOLHDL, LDLDIRECT in the last 72 hours. Thyroid Function Tests: No results for input(s): TSH, T4TOTAL, FREET4, T3FREE, THYROIDAB in the last 72 hours. Anemia Panel: No results for input(s): VITAMINB12, FOLATE, FERRITIN, TIBC, IRON, RETICCTPCT in the last 72 hours. Sepsis Labs: Recent Labs  Lab 09/29/24 2056 09/29/24 2309  LATICACIDVEN 0.8 0.7    Recent Results (from the past 240 hours)  Culture, blood (Routine x 2)     Status: None (Preliminary result)   Collection Time: 09/29/24  7:54 PM   Specimen: Right Antecubital; Blood  Result Value Ref Range Status   Specimen Description   Final    RIGHT ANTECUBITAL BLOOD Performed at [facility], [address] [place], [place] [address]    Special Requests   Final    BOTTLES DRAWN AEROBIC AND ANAEROBIC Blood Culture adequate volume Performed at [facility], [address] [place], [place] [address]    Culture   Final  NO GROWTH 1 DAY Performed at [facility], [address], [place], [place] [address]    Report Status PENDING  Incomplete  Urine Culture     Status: Abnormal (Preliminary result)   Collection Time: 09/29/24  8:50 PM   Specimen: Urine, Random  Result Value Ref Range Status   Specimen Description   Final    URINE, RANDOM Performed at [facility], [address], [place], [place] [address]    Special Requests   Final    NONE Reflexed from [id number] Performed at [facility], [address] [place], [place] [address]    Culture (A)  Final    >=100,000 COLONIES/mL ESCHERICHIA COLI SUSCEPTIBILITIES TO FOLLOW Performed at [facility], [address], [place], [place] [address]    Report Status PENDING  Incomplete  Culture, blood (Routine x  2)     Status: None (Preliminary result)   Collection Time: 09/29/24 11:09 PM   Specimen: Left Antecubital; Blood  Result Value Ref Range Status   Specimen Description   Final    LEFT ANTECUBITAL BLOOD Performed at [facility], [address] [place], [place] [address]    Special Requests   Final    BOTTLES DRAWN AEROBIC AND ANAEROBIC Blood Culture adequate volume Performed at [facility], [address], [place], [place] [address]    Culture   Final    NO GROWTH 1 DAY Performed at [facility], [address] [place], [place] [address]    Report Status PENDING  Incomplete         Radiology Studies: VAS US  ABI WITH/WO TBI Result Date: 10/01/2024  LOWER EXTREMITY DOPPLER STUDY Patient Name:  Donna Alvarez  Date of Exam:   10/01/2024 Medical Rec #: [id number]             Accession #:    [id number] Date of Birth: Mar 10, 1946            Patient Gender: F Patient Age:   2 years Exam Location:  [facility] Procedure:      VAS US  ABI WITH/WO TBI Referring Phys: AMY COX --------------------------------------------------------------------------------  Indications: Ulceration. PVD High Risk Factors: Hypertension, no history of smoking.  Limitations: Today's exam was limited due to bandages. Comparison Study: No prior exam. Performing Technologist: Edilia Elden Appl  Examination Guidelines: A complete evaluation includes at minimum, Doppler waveform signals and systolic blood pressure reading at the level of bilateral brachial, anterior tibial, and posterior tibial arteries, when vessel segments are accessible. Bilateral testing is considered an integral part of a complete examination. Photoelectric Plethysmograph (PPG) waveforms and toe systolic pressure readings are included as required and additional duplex testing as needed. Limited examinations for reoccurring indications may be performed as noted.  ABI Findings:  +---------+------------------+-----+---------+--------+ Right    Rt Pressure (mmHg)IndexWaveform Comment  +---------+------------------+-----+---------+--------+ Brachial 144                    triphasic         +---------+------------------+-----+---------+--------+ PTA      177               1.13 triphasic         +---------+------------------+-----+---------+--------+ DP       153               0.98 triphasic         +---------+------------------+-----+---------+--------+ Burnetta Chester  0.86 Normal            +---------+------------------+-----+---------+--------+ +---------+------------------+-----+---------+-------+ Left     Lt Pressure (mmHg)IndexWaveform Comment +---------+------------------+-----+---------+-------+ Brachial 156                    triphasic        +---------+------------------+-----+---------+-------+ PTA      181               1.16 triphasic        +---------+------------------+-----+---------+-------+ DP       173               1.11 triphasic        +---------+------------------+-----+---------+-------+ Great Toe110               0.71 Normal           +---------+------------------+-----+---------+-------+ +-------+-----------+-----------+------------+------------+ ABI/TBIToday's ABIToday's TBIPrevious ABIPrevious TBI +-------+-----------+-----------+------------+------------+ Right  1.13       0.86                                +-------+-----------+-----------+------------+------------+ Left   1.16       0.71                                +-------+-----------+-----------+------------+------------+  Summary: Right: Resting right ankle-brachial index is within normal range. The right toe-brachial index is normal.  Left: Resting left ankle-brachial index is within normal range. The left toe-brachial index is normal.  *See table(s) above for measurements and observations.  Electronically signed by Debby Robertson on 10/01/2024 at 11:28:38 AM.    Final    DG Chest 2 View if patient is not in a treatment room. Result Date: 09/29/2024 CLINICAL DATA:  Sepsis. EXAM: CHEST - 2 VIEW COMPARISON:  Chest radiograph dated 12/24/2023 FINDINGS: Minimal left lung base atelectasis. Pneumonia is not excluded. There is slight blunting of the costophrenic angles which may represent trace bilateral pleural effusions. There is background of emphysema. No pneumothorax. Stable cardiac silhouette. Atherosclerotic calcification of the aorta. Dilatation of the descending thoracic aorta measuring up to 5.5 cm. No acute osseous pathology. IMPRESSION: 1. Minimal left lung base atelectasis. Pneumonia is not excluded. 2. Trace bilateral pleural effusions. 3. 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 CT may provide better evaluation. Electronically Signed   By: Vanetta Chou M.D.   On: 09/29/2024 20:22        Scheduled Meds:  enoxaparin  (LOVENOX ) injection  40 mg Subcutaneous Q24H   fluticasone  furoate-vilanterol  1 puff Inhalation Daily   furosemide   20 mg Oral Daily   levothyroxine   88 mcg Oral QAC breakfast   pantoprazole   40 mg Oral Daily   pramipexole   1.5 mg Oral QHS   pregabalin   25 mg Oral QHS   Continuous Infusions:  cefTRIAXone  (ROCEPHIN )  IV 2 g (10/01/24 0956)     LOS: 1 day    Time spent: 52 minutes spent on 10/01/2024 caring for this patient face-to-face including chart review, ordering labs/tests, documenting, discussion with nursing staff, consultants, updating family and interview/physical exam    Camellia PARAS Rye Dorado, DO [facility] Available via Epic secure chat 7am-7pm After these hours, please refer to coverage provider listed on [email or web address] 10/01/2024, 12:57 PM   "

## 2024-10-01 NOTE — Evaluation (Signed)
 Physical Therapy Evaluation Patient Details Name: Donna Alvarez MRN: [id number] DOB: 09-Dec-1945 Today's Date: 10/01/2024  History of Present Illness  78 year old female with history of hypertension, depression, venous stasis, hypothyroid, restless leg syndrome, pulmonary hypertension and chronic R hip/LE deficits-pleaes see progress note from ortho PA 1212/25 for details including MRI results who presented to [facility] on 09/29/24 with UTI and sespsis.  Clinical Impression  On eval, pt was Supv-Mod Ind with mobility. She ambulated ~100 feet with a RW (gait appears much more unstable without use of a device). Pt denied pain during session. Recommended she use a RW for ambulation safety and that she follow orthopedics recommendation and referral for OPPT.         If plan is discharge home, recommend the following: A little help with walking and/or transfers;A little help with bathing/dressing/bathroom;Assistance with cooking/housework;Assist for transportation;Help with stairs or ramp for entrance   Can travel by private vehicle        Equipment Recommendations Rolling walker (2 wheels)  Recommendations for Other Services       Functional Status Assessment Patient has had a recent decline in their functional status and demonstrates the ability to make significant improvements in function in a reasonable and predictable amount of time.     Precautions / Restrictions Precautions Precautions: Fall Recall of Precautions/Restrictions: Impaired Precaution/Restrictions Comments: pt with longstanding R hip dysfunction-see ortho PA progress note from 09/19/24 Restrictions Weight Bearing Restrictions Per Provider Order: No      Mobility  Bed Mobility Overal bed mobility: Modified Independent                  Transfers Overall transfer level: Modified independent Equipment used: Rolling walker (2 wheels)                    Ambulation/Gait Ambulation/Gait assistance:  Supervision Gait Distance (Feet): 100 Feet Assistive device: Rolling walker (2 wheels) Gait Pattern/deviations: Step-through pattern, Decreased stance time - right       General Gait Details: No LOB with RW use. Tolerated distance well. Also had pt take a few steps without use of RW-gait appears antalgic (although pt denies pain), R LE appears very unstable.  Stairs            Wheelchair Mobility     Tilt Bed    Modified Rankin (Stroke Patients Only)       Balance Overall balance assessment: Needs assistance           Standing balance-Leahy Scale: Poor Standing balance comment: safer with RW, reports her R LE collapses at times                             Pertinent Vitals/Pain Pain Assessment Pain Assessment: No/denies pain    Home Living Family/patient expects to be discharged to:: Private residence Living Arrangements: Spouse/significant other;Children Available Help at Discharge: Family;Available 24 hours/day Type of Home: House Home Access: Stairs to enter Entrance Stairs-Rails: Right;Left;Can reach both Entrance Stairs-Number of Steps: 5 Alternate Level Stairs-Number of Steps: flight Home Layout: Two level;1/2 bath on main level;Bed/bath upstairs Home Equipment: Shower seat;Grab bars - tub/shower;Rolling Walker (2 wheels)      Prior Function Prior Level of Function : Independent/Modified Independent;Driving             Mobility Comments: ambulatory without a device ADLs Comments: sits to shower,independent in ADLs and IADLs     Extremity/Trunk Assessment   Upper Extremity  Assessment Upper Extremity Assessment: Defer to OT evaluation    Lower Extremity Assessment Lower Extremity Assessment:  (chronic dysfunction and weakness)    Cervical / Trunk Assessment Cervical / Trunk Assessment: Normal  Communication   Communication Communication: No apparent difficulties    Cognition Arousal: Alert Behavior During Therapy: WFL  for tasks assessed/performed                             Following commands: Intact       Cueing Cueing Techniques: Verbal cues     General Comments      Exercises     Assessment/Plan    PT Assessment Patient needs continued PT services  PT Problem List Decreased strength;Decreased range of motion;Decreased activity tolerance;Decreased balance;Decreased mobility;Decreased knowledge of use of DME       PT Treatment Interventions DME instruction;Gait training;Therapeutic activities;Therapeutic exercise;Balance training    PT Goals (Current goals can be found in the Care Plan section)  Acute Rehab PT Goals Patient Stated Goal: get better and regain independence PT Goal Formulation: With patient Time For Goal Achievement: 10/15/24 Potential to Achieve Goals: Good    Frequency Min 3X/week     Co-evaluation               AM-PAC PT 6 Clicks Mobility  Outcome Measure Help needed turning from your back to your side while in a flat bed without using bedrails?: None Help needed moving from lying on your back to sitting on the side of a flat bed without using bedrails?: None Help needed moving to and from a bed to a chair (including a wheelchair)?: A Little Help needed standing up from a chair using your arms (e.g., wheelchair or bedside chair)?: A Little Help needed to walk in hospital room?: A Little Help needed climbing 3-5 steps with a railing? : A Little 6 Click Score: 20    End of Session   Activity Tolerance: Patient tolerated treatment well Patient left: in bed;with call bell/phone within reach   PT Visit Diagnosis: Other abnormalities of gait and mobility (R26.89);History of falling (Z91.81);Unsteadiness on feet (R26.81);Difficulty in walking, not elsewhere classified (R26.2)    Time: 8745-8685 PT Time Calculation (min) (ACUTE ONLY): 20 min   Charges:   PT Evaluation $PT Eval Low Complexity: 1 Low   PT General Charges $$ ACUTE PT VISIT:  1 Visit          Dannial SQUIBB, PT Acute Rehabilitation  Office: [phone]

## 2024-10-01 NOTE — Assessment & Plan Note (Signed)
 10/01/2024 was present on admission. Pt had altered mental status at home and during initial presentation to ER. Her mentation is back to normal. Verified with pt's dtr and husband.

## 2024-10-01 NOTE — Subjective & Objective (Signed)
 Pt seen and examined. Talked with pt's dtr Donna Alvarez, pt's husband Donna Alvarez via phone. Pt consented for Hospital at Home program. Written consent obtained and witnessed by RN. Will have PT/OT seen patient. Potential for transfer to hospital at home this afternoon.

## 2024-10-01 NOTE — Assessment & Plan Note (Signed)
 10/01/2024 on IV rocephin  2 grams daily. Prelim urine cx growing E. Coli. Final sensitivities pending. May need 3-5 days of IV abx pending final MICs. Today is Day #2 of IV Rocephin 

## 2024-10-01 NOTE — Evaluation (Signed)
 Occupational Therapy Evaluation Patient Details Name: Donna Alvarez MRN: [id number] DOB: [birth year] Today's Date: 10/01/2024   History of Present Illness   Ms. Jannessa Ogden is a 78 year old female with history of hypertension, depression, venous stasis, hypothyroid, restless leg syndrome, pulmonary hypertension who presented to [facility] on 09/29/24 with UTI and sespsis.     Clinical Impressions Pt was independent in mobility without AD prior to admission. She was to start OPPT on the 09/28/24 for R hip dysfunction, canceled due to her illness. Pt is somewhat inconsistent when providing history and home set up. Denies current R hip pain. Ambulates with supervision and RW. Obvious limp without use of RW. Pt needs set up to moderate assistance for ADLs, specifically to don socks over unna boots. She plans to return home with her husband and assist from her daughter and restart OPPT. Will follow acutely.      If plan is discharge home, recommend the following:   A little help with bathing/dressing/bathroom;Assist for transportation;Help with stairs or ramp for entrance     Functional Status Assessment   Patient has had a recent decline in their functional status and demonstrates the ability to make significant improvements in function in a reasonable and predictable amount of time.     Equipment Recommendations   None recommended by OT     Recommendations for Other Services         Precautions/Restrictions   Precautions Precautions: Fall Recall of Precautions/Restrictions: Impaired Precaution/Restrictions Comments: pt with longstanding R hip dysfunction Restrictions Weight Bearing Restrictions Per Provider Order: No     Mobility Bed Mobility Overal bed mobility: Modified Independent                  Transfers Overall transfer level: Modified independent Equipment used: Rolling walker (2 wheels)                      Balance Overall  balance assessment: Needs assistance   Sitting balance-Leahy Scale: Good       Standing balance-Leahy Scale: Poor Standing balance comment: safer with RW, reports her R LE collapses at times                           ADL either performed or assessed with clinical judgement   ADL Overall ADL's : Needs assistance/impaired Eating/Feeding: Independent   Grooming: Standing;Supervision/safety   Upper Body Bathing: Set up;Sitting   Lower Body Bathing: Sit to/from stand;Supervison/ safety   Upper Body Dressing : Set up;Sitting   Lower Body Dressing: Total assistance;Bed level Lower Body Dressing Details (indicate cue type and reason): for socks Toilet Transfer: Supervision/safety;Ambulation;Rolling walker (2 wheels)   Toileting- Clothing Manipulation and Hygiene: Modified independent;Sitting/lateral lean       Functional mobility during ADLs: Supervision/safety;Rolling walker (2 wheels)       Vision Ability to See in Adequate Light: 0 Adequate Patient Visual Report: No change from baseline       Perception         Praxis         Pertinent Vitals/Pain Pain Assessment Pain Assessment: No/denies pain     Extremity/Trunk Assessment Upper Extremity Assessment Upper Extremity Assessment: Overall WFL for tasks assessed;Right hand dominant   Lower Extremity Assessment Lower Extremity Assessment: Defer to PT evaluation   Cervical / Trunk Assessment Cervical / Trunk Assessment: Normal   Communication Communication Communication: No apparent difficulties   Cognition Arousal: Alert Behavior During Therapy: So [facility]  for tasks assessed/performed Cognition: Cognition impaired     Awareness: Online awareness impaired, Intellectual awareness intact     Executive functioning impairment (select all impairments): Problem solving OT - Cognition Comments: Pt somewhat of a difficult historian with inconsistencies in DME and her home set up.                  Following commands: Intact       Cueing  General Comments   Cueing Techniques: Verbal cues      Exercises     Shoulder Instructions      Home Living Family/patient expects to be discharged to:: Private residence Living Arrangements: Spouse/significant other;Children Available Help at Discharge: Family;Available 24 hours/day Type of Home: House Home Access: Stairs to enter [employer] of Steps: 5 Entrance Stairs-Rails: Right;Left;Can reach both Home Layout: Two level;1/2 bath on main level;Bed/bath upstairs Alternate Level Stairs-Number of Steps: flight Alternate Level Stairs-Rails: Right;Left Bathroom Shower/Tub: Chief Strategy Officer: Standard     Home Equipment: Shower seat;Grab bars - tub/shower;Rolling Environmental Consultant (2 wheels)          Prior Functioning/Environment Prior Level of Function : Independent/Modified Independent;Driving             Mobility Comments: was supposed to start OPPT on Monday for R hip pain ADLs Comments: sits to shower,independent in ADLs and IADLs    OT Problem List: Decreased cognition;Impaired balance (sitting and/or standing)   OT Treatment/Interventions: Self-care/ADL training;DME and/or AE instruction;Patient/family education;Balance training;Cognitive remediation/compensation;Therapeutic activities      OT Goals(Current goals can be found in the care plan section)   Acute Rehab OT Goals OT Goal Formulation: With patient Time For Goal Achievement: 10/15/24 Potential to Achieve Goals: Good   OT Frequency:  Min 2X/week    Co-evaluation              AM-PAC OT 6 Clicks Daily Activity     Outcome Measure Help from another person eating meals?: None Help from another person taking care of personal grooming?: A Little Help from another person toileting, which includes using toliet, bedpan, or urinal?: A Little Help from another person bathing (including washing, rinsing, drying)?: A Little Help  from another person to put on and taking off regular upper body clothing?: A Little Help from another person to put on and taking off regular lower body clothing?: A Little 6 Click Score: 19   End of Session Equipment Utilized During Treatment: Gait belt;Rolling walker (2 wheels) Nurse Communication: Mobility status;Other (comment) (cognition)  Activity Tolerance: Patient tolerated treatment well Patient left: in bed;with call bell/phone within reach  OT Visit Diagnosis: Unsteadiness on feet (R26.81);Other abnormalities of gait and mobility (R26.89);Other symptoms and signs involving cognitive function                Time: 1254-1314 OT Time Calculation (min): 20 min Charges:  OT General Charges $OT Visit: 1 Visit OT Evaluation $OT Eval Low Complexity: 1 Low  Mliss HERO, OTR/L Acute Rehabilitation Services Office: [phone]   Kennth Mliss Helling 10/01/2024, 1:27 PM

## 2024-10-01 NOTE — Assessment & Plan Note (Addendum)
 10/01/2024 has mild wheezing on exam today. Received 500 ml IVF bolus in ER and was on 125 ml/hr of IVF overnight. Now IVF has stopped. Add Prn duonebs qid for wheezing. Continue with Breo inhaler.

## 2024-10-01 NOTE — Plan of Care (Signed)

## 2024-10-01 NOTE — Progress Notes (Addendum)
 " PROGRESS NOTE    Donna Alvarez  FMW:[id number] DOB: May 19, 1946 DOA: 09/29/2024 PCP: Billy Corean PARAS, MD  Subjective: Pt seen and examined. Talked with pt's dtr Bascom Edelman, pt's husband Akshitha Culmer via phone. Pt consented for Hospital at Home program. Written consent obtained and witnessed by RN. Will have PT/OT seen patient. Potential for transfer to hospital at home this afternoon.   Hospital Course: CC: AMS, fever HPI:  Donna Alvarez is a 78 year old female with history of hypertension, depression, venous stasis, hypothyroid, restless leg syndrome, pulmonary hypertension who does not tolerate CPAP therapy.   09/29/24: she presents to the ED for chief concerns of fever and altered mental status for 2 days.   Vitals at the time of my evaluation showed T of 98.4, rr 26, hr 99, blood pressure 144/69, SpO2 91% on room air. Tech checking vitals showed T of 100.9.   Serum sodium is 137, potassium 3.8, chloride 103, bicarb 22, BUN of 25, serum creatinine of 1.01, eGFR 56, nonfasting blood glucose 105, WBC 8.9, hemoglobin 10.5, platelets of 268.   Lactic acid was 0.8, repeat is 2.7.   ED treatment: Ciprofloxacin  400 mg one-time dose, LR 75 mL/h initiated, acetaminophen  650 mg p.o. one-time dose.  09/29/24: she presents to the ED for chief concerns of fever and altered mental status for 2 days.   09/30/2024: Patient admitted to [facility] for chief concerns of complicated urinary tract infection, meeting sepsis criteria. --------------------------------- At bedside, via virtual encounter, patient was able to confirm her first and last name, her date of birth, the current calendar year, the current location.   Patient reports over the last week, she has noticed odorous urine that is abnormal for her.  Yesterday, patient had a fever at home of 28 F.  Her family stated that she was not acting right for the last 2 days and therefore they brought her to  the emergency department.   Her daughter just left this morning and knows that she is being admitted to the hospital.   She denies chest pain, shortness of breath, dysuria, hematuria, diarrhea, nausea, vomiting.   She reports she has baseline shortness of breath worse with exertion and going uphill that has been ongoing for over a year.  Her primary care provider is aware of this.  Significant Events: Admitted 09/29/2024 Patient admitted to [facility] for chief concerns of complicated urinary tract infection, meeting sepsis criteria.   Admission Labs: WBC 8.9, HgB 10.5, plt 268 INR 1.0 Lactic acid 0.8 Na 137, K 3.8, CO2 of 22, BUN 25, Scr 1.01, glu 105, Ca 10.5 T. Prot 6.1, Alb 3.9, AST 16, ALT 16, alk phos 84, t. Bili 0.8 UA hazy appearance, large LE, negative nitrite, WBC >50, many bacteria  Admission Imaging Studies:   Significant Labs: Urine cx. Prelim E. Coli. Final sensitivities pending  Significant Imaging Studies: ABI show normal bilateral indexes  Antibiotic Therapy: Anti-infectives (From admission, onward)    Start     Dose/Rate Route Frequency Ordered Stop   09/30/24 1000  cefTRIAXone  (ROCEPHIN ) 2 g in sodium chloride  0.9 % 100 mL IVPB        2 g 200 mL/hr over 30 Minutes Intravenous Every 24 hours 09/30/24 0908 10/04/24 0959   09/30/24 0900  ciprofloxacin  (CIPRO ) IVPB 400 mg  Status:  Discontinued        400 mg 200 mL/hr over 60 Minutes Intravenous Every 12 hours 09/30/24 0850 09/30/24 0908   09/29/24 2300  ciprofloxacin  (CIPRO ) IVPB 400 mg  Status:  Discontinued        400 mg 200 mL/hr over 60 Minutes Intravenous Every 12 hours 09/29/24 2254 09/30/24 0850       Procedures:   Consultants:     Assessment and Plan: * Sepsis due to gram-negative UTI (HCC) 12.-23-2025 Patient meets sepsis criteria with elevated respiration rate, temperature of 100.9, source of infection is urine, and altered mentation at home Blood cultures x 2 are in  process Urine cultures in process Lactic acid is reassuringly not elevated NS 500 mL liter bolus ordered on admission LR infusion at 125 mL/h initiated Maintain MAP > 65 Ceftriaxone  2 g IV daily to complete a 5-day course Present on admission  10-01-2024 still  had fever of 101.9 last night (09-30-2024) @ 2116. Continue to monitor. WBC today is 5.9  E. coli UTI 10/01/2024 on IV rocephin  2 grams daily. Prelim urine cx growing E. Coli. Final sensitivities pending. May need 3-5 days of IV abx pending final MICs. Today is Day #2 of IV Rocephin    Acute metabolic encephalopathy - due to UTI and sepsis. 10/01/2024 was present on admission. Pt had altered mental status at home and during initial presentation to ER. Her mentation is back to normal. Verified with pt's dtr and husband.   Depression 09/30/2024 Home duloxetine  30 mg daily resumed  Mild persistent asthma 10/01/2024 has mild wheezing on exam today. Received 500 ml IVF bolus in ER and was on 125 ml/hr of IVF overnight. Now IVF has stopped. Add Prn duonebs qid for wheezing. Continue with Breo inhaler.   Hypothyroidism 09/30/2024 Home levothyroxine , per home dosing resumed  10/01/2024 continue with 88 mcg synthroid .   Restless leg syndrome 09/30/2024 Home pregabalin  25 mg nightly, pramipexole  1.5 mg nightly were resumed  10/01/2024 continue with requip and lyrica .   Essential hypertension 09/30/2024 Home furosemide  20 mg daily with breakfast not resumed in setting of patient getting IVF for possible sepsis Hydralazine  5 mg IV every 6 hours.  For SBP greater than 170, 5 disorder  Furosemide  20 mg daily resumed for 12/24  10/01/2024 back on lasix  today.  Venous insufficiency (chronic) (peripheral) 09/30/2024 unna wrap weekly. Wound care consulted. See pictures. right leg Left leg      10/01/2024 pt states Unna boots placed yesterday. Will need skin to be re-evaluated prior to discharge from [facility]. ABI show  normal indexes.        DVT prophylaxis: enoxaparin  (LOVENOX ) injection 40 mg Start: 09/30/24 2200 Place TED hose Start: 09/30/24 0839  Lovenox    Code Status: Full Code Family Communication: talked via phone with dtr tonya cayton, husband adrian Hanko Disposition Plan: home Reason for continuing need for hospitalization: remains on IV Abx.  Objective: Vitals:   09/30/24 2116 09/30/24 2219 09/30/24 2300 10/01/24 0536  BP:   115/63 121/72  Pulse:   90 81  Resp:   20 20  Temp: (!) 101.9 F (38.8 C) 99.9 F (37.7 C) 99.3 F (37.4 C) 98.8 F (37.1 C)  TempSrc:    Oral  SpO2:   93% 96%  Weight:      Height:        Intake/Output Summary (Last 24 hours) at 10/01/2024 1212 Last data filed at 09/30/2024 1822 Gross per 24 hour  Intake 1010.27 ml  Output --  Net 1010.27 ml   Filed Weights   09/29/24 1941  Weight: 52.2 kg    Examination:  Physical Exam Vitals and nursing note  reviewed.  Constitutional:      General: She is not in acute distress.    Appearance: She is normal weight. She is not toxic-appearing or diaphoretic.  HENT:     Head: Normocephalic and atraumatic.     Nose: Nose normal.  Eyes:     General: No scleral icterus. Cardiovascular:     Rate and Rhythm: Normal rate and regular rhythm.  Pulmonary:     Breath sounds: Wheezing present.     Comments: Scattered wheezes bilaterally. No respiratory distress Abdominal:     General: Abdomen is flat. Bowel sounds are normal. There is no distension.     Palpations: Abdomen is soft.     Tenderness: There is no abdominal tenderness.  Skin:    General: Skin is warm and dry.     Capillary Refill: Capillary refill takes less than 2 seconds.  Neurological:     Mental Status: She is alert and oriented to person, place, and time.     Data Reviewed: I have personally reviewed following labs and imaging studies  CBC: Recent Labs  Lab 09/29/24 2100 10/01/24 0513  WBC 8.9 5.9  NEUTROABS 7.2  --   HGB  10.5* 9.9*  HCT 32.2* 31.3*  MCV 91.7 92.9  PLT 268 234   Basic Metabolic Panel: Recent Labs  Lab 09/29/24 2100 10/01/24 0513  NA 137 137  K 3.8 3.9  CL 103 105  CO2 22 24  GLUCOSE 105* 96  BUN 25* 19  CREATININE 1.01* 0.82  CALCIUM 10.6* 9.4   GFR: Estimated Creatinine Clearance: 39.3 mL/min (by C-G formula based on SCr of 0.82 mg/dL). Liver Function Tests: Recent Labs  Lab 09/29/24 2100  AST 16  ALT 16  ALKPHOS 84  BILITOT 0.8  PROT 6.1*  ALBUMIN 3.9    Coagulation Profile: Recent Labs  Lab 09/29/24 2100  INR 1.0    Sepsis Labs: Recent Labs  Lab 09/29/24 2056 09/29/24 2309  LATICACIDVEN 0.8 0.7    Recent Results (from the past 240 hours)  Culture, blood (Routine x 2)     Status: None (Preliminary result)   Collection Time: 09/29/24  7:54 PM   Specimen: Right Antecubital; Blood  Result Value Ref Range Status   Specimen Description   Final    RIGHT ANTECUBITAL BLOOD Performed at [facility], [address] [place], [place] [address]    Special Requests   Final    BOTTLES DRAWN AEROBIC AND ANAEROBIC Blood Culture adequate volume Performed at [facility], [address] [place], [place] [address]    Culture   Final    NO GROWTH 1 DAY Performed at [facility], [address], [place], [place] [address]    Report Status PENDING  Incomplete  Urine Culture     Status: Abnormal (Preliminary result)   Collection Time: 09/29/24  8:50 PM   Specimen: Urine, Random  Result Value Ref Range Status   Specimen Description   Final    URINE, RANDOM Performed at [facility], [address] [place], [place] [address]    Special Requests   Final    NONE Reflexed from [id number] Performed at [facility], [address] [place], [place] [address]    Culture (A)  Final    >=100,000 COLONIES/mL ESCHERICHIA COLI SUSCEPTIBILITIES TO FOLLOW Performed at [facility], [address], [place], [place] [address]    Report Status PENDING  Incomplete  Culture, blood (Routine  x 2)     Status: None (Preliminary result)   Collection Time: 09/29/24 11:09 PM   Specimen: Left Antecubital; Blood  Result Value Ref Range Status   Specimen Description   Final    LEFT ANTECUBITAL BLOOD Performed at [facility], [address], [place], [place] [address]    Special Requests   Final    BOTTLES DRAWN AEROBIC AND ANAEROBIC Blood Culture adequate volume Performed at [facility], [address] [place], [place] [address]    Culture   Final    NO GROWTH 1 DAY Performed at [facility], [address], [place], [place] [address]    Report Status PENDING  Incomplete     Radiology Studies: VAS US  ABI WITH/WO TBI Result Date: 10/01/2024  LOWER EXTREMITY DOPPLER STUDY Patient Name:  Donna ARMBRISTER  Date of Exam:   10/01/2024 Medical Rec #: [id number]             Accession #:    [id number] Date of Birth: 08/10/1946            Patient Gender: F Patient Age:   78 years Exam Location:  [facility] Procedure:      VAS US  ABI WITH/WO TBI Referring Phys: AMY COX --------------------------------------------------------------------------------  Indications: Ulceration. PVD High Risk Factors: Hypertension, no history of smoking.  Limitations: Today's exam was limited due to bandages. Comparison Study: No prior exam. Performing Technologist: Edilia Elden Appl  Examination Guidelines: A complete evaluation includes at minimum, Doppler waveform signals and systolic blood pressure reading at the level of bilateral brachial, anterior tibial, and posterior tibial arteries, when vessel segments are accessible. Bilateral testing is considered an integral part of a complete examination. Photoelectric Plethysmograph (PPG) waveforms and toe systolic pressure readings are included as required and additional duplex testing as needed. Limited examinations for  reoccurring indications may be performed as noted.  ABI Findings: +---------+------------------+-----+---------+--------+ Right    Rt Pressure (mmHg)IndexWaveform Comment  +---------+------------------+-----+---------+--------+ Brachial 144                    triphasic         +---------+------------------+-----+---------+--------+ PTA      177               1.13 triphasic         +---------+------------------+-----+---------+--------+ DP       153               0.98 triphasic         +---------+------------------+-----+---------+--------+ Great Toe134               0.86 Normal            +---------+------------------+-----+---------+--------+ +---------+------------------+-----+---------+-------+ Left     Lt Pressure (mmHg)IndexWaveform Comment +---------+------------------+-----+---------+-------+ Brachial 156                    triphasic        +---------+------------------+-----+---------+-------+ PTA      181               1.16 triphasic        +---------+------------------+-----+---------+-------+ DP       173               1.11 triphasic        +---------+------------------+-----+---------+-------+ Great Toe110               0.71 Normal           +---------+------------------+-----+---------+-------+ +-------+-----------+-----------+------------+------------+  ABI/TBIToday's ABIToday's TBIPrevious ABIPrevious TBI +-------+-----------+-----------+------------+------------+ Right  1.13       0.86                                +-------+-----------+-----------+------------+------------+ Left   1.16       0.71                                +-------+-----------+-----------+------------+------------+  Summary: Right: Resting right ankle-brachial index is within normal range. The right toe-brachial index is normal.  Left: Resting left ankle-brachial index is within normal range. The left toe-brachial index is normal.  *See table(s) above  for measurements and observations.  Electronically signed by Debby Robertson on 10/01/2024 at 11:28:38 AM.    Final    DG Chest 2 View if patient is not in a treatment room. Result Date: 09/29/2024 CLINICAL DATA:  Sepsis. EXAM: CHEST - 2 VIEW COMPARISON:  Chest radiograph dated 12/24/2023 FINDINGS: Minimal left lung base atelectasis. Pneumonia is not excluded. There is slight blunting of the costophrenic angles which may represent trace bilateral pleural effusions. There is background of emphysema. No pneumothorax. Stable cardiac silhouette. Atherosclerotic calcification of the aorta. Dilatation of the descending thoracic aorta measuring up to 5.5 cm. No acute osseous pathology. IMPRESSION: 1. Minimal left lung base atelectasis. Pneumonia is not excluded. 2. Trace bilateral pleural effusions. 3. 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 CT may provide better evaluation. Electronically Signed   By: Vanetta Chou M.D.   On: 09/29/2024 20:22    Scheduled Meds:  enoxaparin  (LOVENOX ) injection  40 mg Subcutaneous Q24H   fluticasone  furoate-vilanterol  1 puff Inhalation Daily   furosemide   20 mg Oral Daily   ipratropium-albuterol   3 mL Nebulization STAT   levothyroxine   88 mcg Oral QAC breakfast   pantoprazole   40 mg Oral Daily   pramipexole   1.5 mg Oral QHS   pregabalin   25 mg Oral QHS   Continuous Infusions:  cefTRIAXone  (ROCEPHIN )  IV 2 g (10/01/24 0956)     LOS: 1 day   Time spent: 65 minutes. No Charge  Camellia Door, DO  [facility]  10/01/2024, 12:12 PM  "

## 2024-10-02 DIAGNOSIS — N390 Urinary tract infection, site not specified: Secondary | ICD-10-CM | POA: Diagnosis not present

## 2024-10-02 DIAGNOSIS — A4150 Gram-negative sepsis, unspecified: Secondary | ICD-10-CM | POA: Diagnosis not present

## 2024-10-02 LAB — URINE CULTURE: Culture: >=100000 — AB

## 2024-10-02 MED ORDER — CIPROFLOXACIN HCL 500 MG PO TABS: 500.0000 mg | ORAL_TABLET | Freq: Two times a day (BID) | ORAL | 0 refills | Status: AC

## 2024-10-02 NOTE — Discharge Summary (Signed)
 " Physician Discharge Summary  Donna Alvarez FMW:[id number] DOB: Mar 13, 1946 DOA: 09/29/2024  PCP: Billy Corean PARAS, MD  Admit date: 09/29/2024 Discharge date: 10/02/2024  Admitted From: Home Disposition: Home  Recommendations for Outpatient Follow-up:  Follow up with PCP in 1-2 weeks Continue ciprofloxacin  to complete 5-day course for  E. coli urinary tract infection Outpatient follow-up with cardiothoracic surgery for continued surveillance of abdominal aortic aneurysm  Home Health: Resume outpatient physical therapy Equipment/Devices: None  Discharge Condition: Stable CODE STATUS: Full code Diet recommendation: Heart healthy diet  History of present illness:  Donna Alvarez is a 78 y.o. female with past medical history significant for HTN, descending thoracic aortic aneurysm, depression, hypothyroidism, RLS, chronic venous stasis, pulmonary hypertension intolerant of CPAP who presented to [facility] on 09/29/2024 from home with complaints of fever and confusion ongoing over the last 2 days.  Additionally patient with decreased oral intake.  At baseline minimally ambulatory.  Follows with wound care outpatient for bilateral lower extremity edema in which she has Unna boots in place which her husband changes.   In the ED, temperature 101.9 F, HR 100, RR 23, BP 124/67, SpO2 96% on room air.  WBC 8.9, hemoglobin 10.5, platelet count 268.  Sodium 137, potassium 3.8, chloride 103, CO2 22, glucose 105, BUN 25, creat 1.01.  AST 16, ALT 16, total bilirubin 0.8.  Lactic acid 0.8.  Urinalysis with large leukocytes, negative nitrite, many bacteria, greater than 50 WBCs.  Chest x-ray with trace bilateral pleural effusions, 5.5 cm distal descending thoracic aortic aneurysm.  Blood cultures and urine culture obtained.  Hospital course:  Sepsis, POA E. coli urinary tract infection Patient presenting to ED with 2-day history of fever and confusion.  Temperature 101.9 F  on admission, tachycardic, tachypneic with mild encephalopathy.  WBC count 8.9, lactic acid 0.8.  Urinalysis consistent with UTI.  Urine culture positive for E. coli with resistance to ampicillin, cefazolin , ampicillin/sulbactam.  Patient was initially placed on ceftriaxone  and will transition to ciprofloxacin  5 mg p.o. twice daily to complete 5-day course.  Outpatient follow-up with PCP.   HTN Lasix  20 mg p.o. daily   History of descending thoracic aortic aneurysm Follows with [facility] cardiothoracic [facility].  Denies any chest pain, no abdominal pain, no back pain.  Continue outpatient follow-up, annual surveillance.   Hypothyroidism Levothyroxine  88 mcg p.o. daily   GERD Continue PPI.   Restless leg syndrome Pramipexole  1.5 mg p.o. nightly   Chronic venous stasis Follows with wound care center outpatient.  Does biweekly Unna boot changes that is performed by her husband at home.  ABIs within normal limits. Seen by wound care RN.  Cleanse RLE with NS, pat dry.  Apply Xeroform to wound bed and cover with silicone foam dressing.  Change dressing with every Unna boot change/application.  Patient reports PCP referring to [facility].   Pulmonary hypertension Intolerant of CPAP  Discharge Diagnoses:  Principal Problem:   Sepsis due to gram-negative UTI (HCC) Active Problems:   E. coli UTI   Depression   Acute metabolic encephalopathy - due to UTI and sepsis.   Essential hypertension   Restless leg syndrome   Hypothyroidism   Mild persistent asthma   OSA (obstructive sleep apnea)   Venous insufficiency (chronic) (peripheral)    Discharge Instructions  Discharge Instructions     Call MD for:  difficulty breathing, headache or visual disturbances   Complete by: As directed  Call MD for:  extreme fatigue   Complete by: As directed    Call MD for:  persistant dizziness or light-headedness   Complete by: As directed    Call  MD for:  persistant nausea and vomiting   Complete by: As directed    Call MD for:  severe uncontrolled pain   Complete by: As directed    Call MD for:  temperature >100.4   Complete by: As directed    Discharge wound care:   Complete by: As directed    Cleanse RLE with NS, pat dry.  Apply Xeroform to wound bed and cover with silicone foam dressing.  Change dressing with every Unna boot change/application.   Increase activity slowly   Complete by: As directed       Allergies as of 10/02/2024       Reactions   Cephalexin Hives   Doxycycline Rash   Blisters to toes and hands Blisters to toes and hands   Risedronate Other (See Comments)   Bone pain Bone pain   Sulfamethoxazole-trimethoprim Other (See Comments)        Medication List     STOP taking these medications    amLODipine  2.5 MG tablet Commonly known as: NORVASC    aspirin  325 MG tablet Commonly known as: Bayer Aspirin    DSS 100 MG Caps   DULoxetine  30 MG capsule Commonly known as: CYMBALTA    HYDROcodone -acetaminophen  5-325 MG tablet Commonly known as: NORCO/VICODIN   losartan 25 MG tablet Commonly known as: COZAAR   methocarbamol  500 MG tablet Commonly known as: Robaxin    traMADol  50 MG tablet Commonly known as: ULTRAM        TAKE these medications    pramipexole  1.5 MG tablet Commonly known as: MIRAPEX  Take 1.5 mg by mouth at bedtime. The timing of this medication is very important.   Breo Ellipta  100-25 MCG/ACT Aepb Generic drug: fluticasone  furoate-vilanterol Inhale 1 puff into the lungs daily.   Cholecalciferol  75 MCG (3000 UT) Tabs Take 3,000 Units by mouth daily.   ciprofloxacin  500 MG tablet Commonly known as: Cipro  Take 1 tablet (500 mg total) by mouth 2 (two) times daily for 2 days. Start taking on: October 03, 2024   esomeprazole 20 MG capsule Commonly known as: NEXIUM Take 20 mg by mouth every morning.   furosemide  20 MG tablet Commonly known as: LASIX  Take 20 mg by  mouth daily after breakfast.   meloxicam 15 MG tablet Commonly known as: MOBIC Take 15 mg by mouth daily.   potassium chloride  10 MEQ CR capsule Commonly known as: MICRO-K  Take 10 mEq by mouth daily.   pregabalin  25 MG capsule Commonly known as: LYRICA  Take 1 capsule (25 mg total) by mouth at bedtime. Morning and bedtime   Synthroid  88 MCG tablet Generic drug: levothyroxine  Take 88 mcg by mouth See admin instructions. take 88mcg daily and 176 mcg on Sundays\\ MUST HAVE NAME BRAND               Durable Medical Equipment  (From admission, onward)           Start     Ordered   10/02/24 0745  For home use only DME Walker rolling  Once       Question Answer Comment  Walker: With 5 Inch Wheels   Patient needs a walker to treat with the following condition Gait abnormality      12 /25/25 0744  Discharge Care Instructions  (From admission, onward)           Start     Ordered   10/02/24 0000  Discharge wound care:       Comments: Cleanse RLE with NS, pat dry.  Apply Xeroform to wound bed and cover with silicone foam dressing.  Change dressing with every Unna boot change/application.   10/02/24 1048            Follow-up Information     Billy Corean PARAS, MD. Schedule an appointment as soon as possible for a visit in 1 week(s).   Specialty: Internal Medicine Contact information: [address] [place] [address] [phone]         Donna Lucie Gibson, PA-C. Schedule an appointment as soon as possible for a visit.   Specialty: General Practice Why: F/U aortic aneurysm Contact information: [address] [place] [address] [phone]                Allergies[1]  Consultations: None   Procedures/Studies: VAS US  ABI WITH/WO TBI Result Date: 10/01/2024  LOWER EXTREMITY DOPPLER STUDY Patient Name:  TRENIA TENNYSON  Date of Exam:   10/01/2024 Medical Rec #: [id number]             Accession #:     [id number] Date of Birth: December 26, 1945            Patient Gender: F Patient Age:   78 years Exam Location:  [facility] Procedure:      VAS US  ABI WITH/WO TBI Referring Phys: AMY COX --------------------------------------------------------------------------------  Indications: Ulceration. PVD High Risk Factors: Hypertension, no history of smoking.  Limitations: Today's exam was limited due to bandages. Comparison Study: No prior exam. Performing Technologist: Edilia Elden Appl  Examination Guidelines: A complete evaluation includes at minimum, Doppler waveform signals and systolic blood pressure reading at the level of bilateral brachial, anterior tibial, and posterior tibial arteries, when vessel segments are accessible. Bilateral testing is considered an integral part of a complete examination. Photoelectric Plethysmograph (PPG) waveforms and toe systolic pressure readings are included as required and additional duplex testing as needed. Limited examinations for reoccurring indications may be performed as noted.  ABI Findings: +---------+------------------+-----+---------+--------+ Right    Rt Pressure (mmHg)IndexWaveform Comment  +---------+------------------+-----+---------+--------+ Brachial 144                    triphasic         +---------+------------------+-----+---------+--------+ PTA      177               1.13 triphasic         +---------+------------------+-----+---------+--------+ DP       153               0.98 triphasic         +---------+------------------+-----+---------+--------+ Great Toe134               0.86 Normal            +---------+------------------+-----+---------+--------+ +---------+------------------+-----+---------+-------+ Left     Lt Pressure (mmHg)IndexWaveform Comment +---------+------------------+-----+---------+-------+ Brachial 156                    triphasic        +---------+------------------+-----+---------+-------+  PTA      181               1.16 triphasic        +---------+------------------+-----+---------+-------+ DP  173               1.11 triphasic        +---------+------------------+-----+---------+-------+ Great Toe110               0.71 Normal           +---------+------------------+-----+---------+-------+ +-------+-----------+-----------+------------+------------+ ABI/TBIToday's ABIToday's TBIPrevious ABIPrevious TBI +-------+-----------+-----------+------------+------------+ Right  1.13       0.86                                +-------+-----------+-----------+------------+------------+ Left   1.16       0.71                                +-------+-----------+-----------+------------+------------+  Summary: Right: Resting right ankle-brachial index is within normal range. The right toe-brachial index is normal.  Left: Resting left ankle-brachial index is within normal range. The left toe-brachial index is normal.  *See table(s) above for measurements and observations.  Electronically signed by Debby Robertson on 10/01/2024 at 11:28:38 AM.    Final    DG Chest 2 View if patient is not in a treatment room. Result Date: 09/29/2024 CLINICAL DATA:  Sepsis. EXAM: CHEST - 2 VIEW COMPARISON:  Chest radiograph dated 12/24/2023 FINDINGS: Minimal left lung base atelectasis. Pneumonia is not excluded. There is slight blunting of the costophrenic angles which may represent trace bilateral pleural effusions. There is background of emphysema. No pneumothorax. Stable cardiac silhouette. Atherosclerotic calcification of the aorta. Dilatation of the descending thoracic aorta measuring up to 5.5 cm. No acute osseous pathology. IMPRESSION: 1. Minimal left lung base atelectasis. Pneumonia is not excluded. 2. Trace bilateral pleural effusions. 3. 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 CT may provide better evaluation. Electronically Signed   By: Vanetta Chou M.D.   On:  09/29/2024 20:22     Subjective: Patient seen examined bedside, lying in bed.  Eating breakfast.  No complaints.  Urine culture now finalized and ready for discharge home.  No other questions or concerns at this time.  Denies headache, no dizziness, no chest pain, no palpitations, no shortness of breath, no abdominal pain, no fever/chills/night sweats, no nausea/vomit/diarrhea, no focal weakness, no fatigue, no paresthesias.  No acute events overnight per nursing.  Discharge Exam: Vitals:   10/02/24 0512 10/02/24 0854  BP: 138/79   Pulse: 91   Resp: 20   Temp: 98.1 F (36.7 C)   SpO2: 95% 94%   Vitals:   10/01/24 1919 10/01/24 2035 10/02/24 0512 10/02/24 0854  BP:  (!) 112/59 138/79   Pulse:  88 91   Resp:  20 20   Temp: 99.2 F (37.3 C) 98.8 F (37.1 C) 98.1 F (36.7 C)   TempSrc: Oral     SpO2:  94% 95% 94%  Weight:      Height:        Physical Exam: GEN: NAD, alert and oriented x 3, elderly in appearance HEENT: NCAT, PERRL, EOMI, sclera clear, MMM PULM: CTAB w/o wheezes/crackles, normal respiratory effort on room air CV: RRR w/o M/G/R GI: abd soft, NTND, NABS, no R/G/M MSK: no peripheral edema, moves all extremities independently with preserved muscle strength, Unna boots in place NEURO: No focal neurological deficit PSYCH: normal mood/affect Integumentary: Chronic venous changes bilateral extremities as depicted below, otherwise no other concerning rashes/lesions/wounds nonexposed skin surfaces  The results of significant diagnostics from this hospitalization (including imaging, microbiology, ancillary and laboratory) are listed below for reference.     Microbiology: Recent Results (from the past 240 hours)  Culture, blood (Routine x 2)     Status: None (Preliminary result)   Collection Time: 09/29/24  7:54 PM   Specimen: Right Antecubital; Blood  Result Value Ref Range Status   Specimen Description   Final    RIGHT ANTECUBITAL BLOOD Performed  at [facility], [address], [place], [place] [address]    Special Requests   Final    BOTTLES DRAWN AEROBIC AND ANAEROBIC Blood Culture adequate volume Performed at [facility], [address] [place], [place] [address]    Culture   Final    NO GROWTH 1 DAY Performed at [facility], [address] [place], [place] [address]    Report Status PENDING  Incomplete  Urine Culture     Status: Abnormal   Collection Time: 09/29/24  8:50 PM   Specimen: Urine, Random  Result Value Ref Range Status   Specimen Description   Final    URINE, RANDOM Performed at [facility], [address], [place], [place] [address]    Special Requests   Final    NONE Reflexed from [id number] Performed at [facility], [address], [place], [place] [address]    Culture >=100,000 COLONIES/mL ESCHERICHIA COLI (A)  Final   Report Status 10/02/2024 FINAL  Final   Organism ID, Bacteria ESCHERICHIA COLI (A)  Final      Susceptibility   Escherichia coli - MIC*    AMPICILLIN >=32 RESISTANT Resistant     CEFAZOLIN  (URINE) Value in next row Resistant      >=32 RESISTANTThis is a modified FDA-approved test that has been validated and its performance characteristics determined by the reporting laboratory.  This laboratory is certified under the Clinical Laboratory Improvement Amendments CLIA as qualified to perform high complexity clinical laboratory testing.    CEFEPIME Value in next row Sensitive      >=32 RESISTANTThis is a modified FDA-approved test that has been validated and its performance characteristics determined by the reporting laboratory.  This laboratory is certified under the Clinical Laboratory Improvement Amendments CLIA as qualified to perform high complexity clinical laboratory testing.    ERTAPENEM Value in next row Sensitive      >=32 RESISTANTThis is a modified FDA-approved test that has been validated and its performance  characteristics determined by the reporting laboratory.  This laboratory is certified under the Clinical Laboratory Improvement Amendments CLIA as qualified to perform high complexity clinical laboratory testing.    CEFTRIAXONE  Value in next row Sensitive      >=32 RESISTANTThis is a modified FDA-approved test that has been validated and its performance characteristics determined by the reporting laboratory.  This laboratory is certified under the Clinical Laboratory Improvement Amendments CLIA as qualified to perform high complexity clinical laboratory testing.    CIPROFLOXACIN  Value in next row Sensitive      >=32 RESISTANTThis is a modified FDA-approved test that has been validated and its performance characteristics determined by the reporting laboratory.  This laboratory is certified under the Clinical Laboratory Improvement Amendments CLIA as qualified to perform high complexity clinical laboratory testing.    GENTAMICIN Value in next row Sensitive      >=32 RESISTANTThis is a modified FDA-approved test that has been validated and its performance characteristics determined by the reporting laboratory.  This  laboratory is certified under the Clinical Laboratory Improvement Amendments CLIA as qualified to perform high complexity clinical laboratory testing.    NITROFURANTOIN Value in next row Sensitive      >=32 RESISTANTThis is a modified FDA-approved test that has been validated and its performance characteristics determined by the reporting laboratory.  This laboratory is certified under the Clinical Laboratory Improvement Amendments CLIA as qualified to perform high complexity clinical laboratory testing.    TRIMETH/SULFA Value in next row Sensitive      >=32 RESISTANTThis is a modified FDA-approved test that has been validated and its performance characteristics determined by the reporting laboratory.  This laboratory is certified under the Clinical Laboratory Improvement Amendments CLIA as  qualified to perform high complexity clinical laboratory testing.    AMPICILLIN/SULBACTAM Value in next row Resistant      >=32 RESISTANTThis is a modified FDA-approved test that has been validated and its performance characteristics determined by the reporting laboratory.  This laboratory is certified under the Clinical Laboratory Improvement Amendments CLIA as qualified to perform high complexity clinical laboratory testing.    PIP/TAZO Value in next row Intermediate      64 INTERMEDIATEThis is a modified FDA-approved test that has been validated and its performance characteristics determined by the reporting laboratory.  This laboratory is certified under the Clinical Laboratory Improvement Amendments CLIA as qualified to perform high complexity clinical laboratory testing.    MEROPENEM Value in next row Sensitive      64 INTERMEDIATEThis is a modified FDA-approved test that has been validated and its performance characteristics determined by the reporting laboratory.  This laboratory is certified under the Clinical Laboratory Improvement Amendments CLIA as qualified to perform high complexity clinical laboratory testing.    * >=100,000 COLONIES/mL ESCHERICHIA COLI  Culture, blood (Routine x 2)     Status: None (Preliminary result)   Collection Time: 09/29/24 11:09 PM   Specimen: Left Antecubital; Blood  Result Value Ref Range Status   Specimen Description   Final    LEFT ANTECUBITAL BLOOD Performed at [facility], [address] [place], [place] [address]    Special Requests   Final    BOTTLES DRAWN AEROBIC AND ANAEROBIC Blood Culture adequate volume Performed at [facility], [address], [place], [place] [address]    Culture   Final    NO GROWTH 1 DAY Performed at [facility], [address] [place], [place] [address]    Report Status PENDING  Incomplete     Labs: BNP (last 3 results) No results for input(s): BNP in the last 8760  hours. Basic Metabolic Panel: Recent Labs  Lab 09/29/24 2100 10/01/24 0513  NA 137 137  K 3.8 3.9  CL 103 105  CO2 22 24  GLUCOSE 105* 96  BUN 25* 19  CREATININE 1.01* 0.82  CALCIUM 10.6* 9.4   Liver Function Tests: Recent Labs  Lab 09/29/24 2100  AST 16  ALT 16  ALKPHOS 84  BILITOT 0.8  PROT 6.1*  ALBUMIN 3.9   No results for input(s): LIPASE, AMYLASE in the last 168 hours. No results for input(s): AMMONIA in the last 168 hours. CBC: Recent Labs  Lab 09/29/24 2100 10/01/24 0513  WBC 8.9 5.9  NEUTROABS 7.2  --   HGB 10.5* 9.9*  HCT 32.2* 31.3*  MCV 91.7 92.9  PLT 268 234   Cardiac Enzymes: No results for input(s): CKTOTAL, CKMB, CKMBINDEX, TROPONINI in the last 168 hours. BNP: Invalid input(s): POCBNP  CBG: No results for input(s): GLUCAP in the last 168 hours. D-Dimer No results for input(s): DDIMER in the last 72 hours. Hgb A1c No results for input(s): HGBA1C in the last 72 hours. Lipid Profile No results for input(s): CHOL, HDL, LDLCALC, TRIG, CHOLHDL, LDLDIRECT in the last 72 hours. Thyroid function studies No results for input(s): TSH, T4TOTAL, T3FREE, THYROIDAB in the last 72 hours.  Invalid input(s): FREET3 Anemia work up No results for input(s): VITAMINB12, FOLATE, FERRITIN, TIBC, IRON, RETICCTPCT in the last 72 hours. Urinalysis    Component Value Date/Time   COLORURINE YELLOW 09/29/2024 2050   APPEARANCEUR HAZY (A) 09/29/2024 2050   LABSPEC 1.021 09/29/2024 2050   PHURINE 6.0 09/29/2024 2050   GLUCOSEU NEGATIVE 09/29/2024 2050   HGBUR TRACE (A) 09/29/2024 2050   BILIRUBINUR NEGATIVE 09/29/2024 2050   KETONESUR 40 (A) 09/29/2024 2050   PROTEINUR 30 (A) 09/29/2024 2050   NITRITE NEGATIVE 09/29/2024 2050   LEUKOCYTESUR LARGE (A) 09/29/2024 2050   Sepsis Labs Recent Labs  Lab 09/29/24 2100 10/01/24 0513  WBC 8.9 5.9   Microbiology Recent Results (from the past 240 hours)   Culture, blood (Routine x 2)     Status: None (Preliminary result)   Collection Time: 09/29/24  7:54 PM   Specimen: Right Antecubital; Blood  Result Value Ref Range Status   Specimen Description   Final    RIGHT ANTECUBITAL BLOOD Performed at [facility], [address], [place], [place] [address]    Special Requests   Final    BOTTLES DRAWN AEROBIC AND ANAEROBIC Blood Culture adequate volume Performed at [facility], [address], [place], [place] [address]    Culture   Final    NO GROWTH 1 DAY Performed at [facility], [address] [place], [place] [address]    Report Status PENDING  Incomplete  Urine Culture     Status: Abnormal   Collection Time: 09/29/24  8:50 PM   Specimen: Urine, Random  Result Value Ref Range Status   Specimen Description   Final    URINE, RANDOM Performed at [facility], [address], [place], [place] [address]    Special Requests   Final    NONE Reflexed from [id number] Performed at [facility], [address], [place], [place] [address]    Culture >=100,000 COLONIES/mL ESCHERICHIA COLI (A)  Final   Report Status 10/02/2024 FINAL  Final   Organism ID, Bacteria ESCHERICHIA COLI (A)  Final      Susceptibility   Escherichia coli - MIC*    AMPICILLIN >=32 RESISTANT Resistant     CEFAZOLIN  (URINE) Value in next row Resistant      >=32 RESISTANTThis is a modified FDA-approved test that has been validated and its performance characteristics determined by the reporting laboratory.  This laboratory is certified under the Clinical Laboratory Improvement Amendments CLIA as qualified to perform high complexity clinical laboratory testing.    CEFEPIME Value in next row Sensitive      >=32 RESISTANTThis is a modified FDA-approved test that has been validated and its performance characteristics determined by the reporting laboratory.  This laboratory is certified under the Clinical  Laboratory Improvement Amendments CLIA as qualified to perform high complexity clinical laboratory testing.    ERTAPENEM Value in next row Sensitive      >=32 RESISTANTThis is a modified FDA-approved test that has been validated and its performance characteristics determined by the reporting laboratory.  This laboratory is certified  under the Clinical Laboratory Improvement Amendments CLIA as qualified to perform high complexity clinical laboratory testing.    CEFTRIAXONE  Value in next row Sensitive      >=32 RESISTANTThis is a modified FDA-approved test that has been validated and its performance characteristics determined by the reporting laboratory.  This laboratory is certified under the Clinical Laboratory Improvement Amendments CLIA as qualified to perform high complexity clinical laboratory testing.    CIPROFLOXACIN  Value in next row Sensitive      >=32 RESISTANTThis is a modified FDA-approved test that has been validated and its performance characteristics determined by the reporting laboratory.  This laboratory is certified under the Clinical Laboratory Improvement Amendments CLIA as qualified to perform high complexity clinical laboratory testing.    GENTAMICIN Value in next row Sensitive      >=32 RESISTANTThis is a modified FDA-approved test that has been validated and its performance characteristics determined by the reporting laboratory.  This laboratory is certified under the Clinical Laboratory Improvement Amendments CLIA as qualified to perform high complexity clinical laboratory testing.    NITROFURANTOIN Value in next row Sensitive      >=32 RESISTANTThis is a modified FDA-approved test that has been validated and its performance characteristics determined by the reporting laboratory.  This laboratory is certified under the Clinical Laboratory Improvement Amendments CLIA as qualified to perform high complexity clinical laboratory testing.    TRIMETH/SULFA Value in next row Sensitive       >=32 RESISTANTThis is a modified FDA-approved test that has been validated and its performance characteristics determined by the reporting laboratory.  This laboratory is certified under the Clinical Laboratory Improvement Amendments CLIA as qualified to perform high complexity clinical laboratory testing.    AMPICILLIN/SULBACTAM Value in next row Resistant      >=32 RESISTANTThis is a modified FDA-approved test that has been validated and its performance characteristics determined by the reporting laboratory.  This laboratory is certified under the Clinical Laboratory Improvement Amendments CLIA as qualified to perform high complexity clinical laboratory testing.    PIP/TAZO Value in next row Intermediate      64 INTERMEDIATEThis is a modified FDA-approved test that has been validated and its performance characteristics determined by the reporting laboratory.  This laboratory is certified under the Clinical Laboratory Improvement Amendments CLIA as qualified to perform high complexity clinical laboratory testing.    MEROPENEM Value in next row Sensitive      64 INTERMEDIATEThis is a modified FDA-approved test that has been validated and its performance characteristics determined by the reporting laboratory.  This laboratory is certified under the Clinical Laboratory Improvement Amendments CLIA as qualified to perform high complexity clinical laboratory testing.    * >=100,000 COLONIES/mL ESCHERICHIA COLI  Culture, blood (Routine x 2)     Status: None (Preliminary result)   Collection Time: 09/29/24 11:09 PM   Specimen: Left Antecubital; Blood  Result Value Ref Range Status   Specimen Description   Final    LEFT ANTECUBITAL BLOOD Performed at [facility], [address], [place], [place] [address]    Special Requests   Final    BOTTLES DRAWN AEROBIC AND ANAEROBIC Blood Culture adequate volume Performed at [facility], [address] [place], [place] [address]     Culture   Final    NO GROWTH 1 DAY Performed at [facility], [address] [place], [place] [address]    Report Status PENDING  Incomplete     Time coordinating discharge: Over  30 minutes  SIGNED:   Camellia PARAS Brunette Lavalle, DO  [facility] 10/02/2024, 10:48 AM     [1]  Allergies Allergen Reactions   Cephalexin Hives   Doxycycline Rash    Blisters to toes and hands Blisters to toes and hands    Risedronate Other (See Comments)    Bone pain Bone pain    Sulfamethoxazole-Trimethoprim Other (See Comments)   "

## 2024-10-02 NOTE — Progress Notes (Signed)
" °   10/02/24 1243  TOC Brief Assessment  Insurance and Status Reviewed  Patient has primary care physician Yes  Home environment has been reviewed home  Prior level of function: mod independent  Prior/Current Home Services No current home services  Social Drivers of Health Review SDOH reviewed no interventions necessary  Readmission risk has been reviewed Yes  Transition of care needs no transition of care needs at this time    "

## 2024-10-02 NOTE — Progress Notes (Signed)
 PT does not want to use CPAP this admission.

## 2024-10-02 NOTE — Progress Notes (Signed)
 Occupational Therapy Treatment Patient Details Name: Donna Alvarez MRN: [id number] DOB: 05/26/1946 Today's Date: 10/02/2024   History of present illness 78 year old female with history of hypertension, depression, venous stasis, hypothyroid, restless leg syndrome, pulmonary hypertension and chronic R hip/LE deficits-pleaes see progress note from ortho PA 1212/25 for details including MRI results who presented to [facility] on 09/29/24 with UTI and sespsis.   OT comments  Pt with discharge orders and nursing staff in room removing tele box/preparing for discharge on arrival. Provided fall prevention handout and reviewed. Engaged pt in [employer]ung[employer] of Cognition and pt scoring a 4 with errors only in delayed memory recall. Does take increased time for processing. Same home set up provided as documented from eval yesterday. Able to recall 1 fall prevention strategy at end of session from those reviewed. Pt reports good support at home.      If plan is discharge home, recommend the following:  A little help with bathing/dressing/bathroom;Assist for transportation;Help with stairs or ramp for entrance   Equipment Recommendations  None recommended by OT    Recommendations for Other Services      Precautions / Restrictions Precautions Precautions: Fall Recall of Precautions/Restrictions: Impaired Precaution/Restrictions Comments: pt with longstanding R hip dysfunction-see ortho PA progress note from 09/19/24 Restrictions Weight Bearing Restrictions Per Provider Order: No       Mobility Bed Mobility                    Transfers                         Balance                                           ADL either performed or assessed with clinical judgement   ADL                                         General ADL Comments: focus session on cognitive assessment as RN is going around room prepping pt for discharge  (removing lines/etc)    Extremity/Trunk Assessment              Vision       Perception     Praxis     Communication Communication Communication: No apparent difficulties   Cognition Arousal: Alert Behavior During Therapy: WFL for tasks assessed/performed Cognition: Cognition impaired       Memory impairment (select all impairments): Short-term memory   Executive functioning impairment (select all impairments): Problem solving (also slow processing at times) OT - Cognition Comments: pt scoring a 4 on the short blessed test of cognition with 2 errors in delayed memory recall. Provided same home set up today as what was documented yesterday                 Following commands: Intact        Cueing   Cueing Techniques: Verbal cues  Exercises      Shoulder Instructions       General Comments      Pertinent Vitals/ Pain       Pain Assessment Pain Assessment: Faces Faces Pain Scale: Hurts little more Pain Location: generalized  Home Living  Prior Functioning/Environment              Frequency  Min 2X/week        Progress Toward Goals  OT Goals(current goals can now be found in the care plan section)     Acute Rehab OT Goals OT Goal Formulation: With patient Time For Goal Achievement: 10/15/24 Potential to Achieve Goals: Good ADL Goals Pt Will Perform Grooming: with modified independence;standing Pt Will Perform Lower Body Bathing: with modified independence;with adaptive equipment Pt Will Perform Lower Body Dressing: with modified independence;with adaptive equipment;sit to/from stand Pt Will Transfer to Toilet: with modified independence;ambulating;regular height toilet Pt Will Perform Tub/Shower Transfer: Tub transfer;with supervision;ambulating;shower seat;rolling walker Additional ADL Goal #1: Pt will state at least 3 fall prevention strategies as instructed.  Plan       Co-evaluation                 AM-PAC OT 6 Clicks Daily Activity     Outcome Measure   Help from another person eating meals?: None Help from another person taking care of personal grooming?: A Little Help from another person toileting, which includes using toliet, bedpan, or urinal?: A Little Help from another person bathing (including washing, rinsing, drying)?: A Little Help from another person to put on and taking off regular upper body clothing?: A Little Help from another person to put on and taking off regular lower body clothing?: A Little 6 Click Score: 19    End of Session    OT Visit Diagnosis: Unsteadiness on feet (R26.81);Other abnormalities of gait and mobility (R26.89);Other symptoms and signs involving cognitive function   Activity Tolerance Patient tolerated treatment well   Patient Left in bed;with call bell/phone within reach   Nurse Communication Mobility status        Time: 1100-1113 OT Time Calculation (min): 13 min  Charges: OT General Charges $OT Visit: 1 Visit OT Treatments $Cognitive Funtion inital: Initial 15 mins  Donna Alvarez, OTR/L [facility] Acute Rehabilitation Office: [phone]   Donna Alvarez 10/02/2024, 11:19 AM

## 2024-10-02 NOTE — Plan of Care (Signed)

## 2024-10-05 LAB — CULTURE, BLOOD (ROUTINE X 2)
Culture: NO GROWTH
Culture: NO GROWTH
Special Requests: ADEQUATE
Special Requests: ADEQUATE

## 2024-10-14 ENCOUNTER — Encounter (HOSPITAL_BASED_OUTPATIENT_CLINIC_OR_DEPARTMENT_OTHER): Admitting: Internal Medicine

## 2024-11-21 ENCOUNTER — Ambulatory Visit (HOSPITAL_BASED_OUTPATIENT_CLINIC_OR_DEPARTMENT_OTHER): Admitting: Orthopaedic Surgery
